# Patient Record
Sex: Female | Born: 1945 | Race: Black or African American | Hispanic: No | Smoking: Never smoker
Health system: Southern US, Community
[De-identification: ages and names within clinical notes are randomized; demographics above are authoritative.]

## PROBLEM LIST (undated history)

## (undated) DIAGNOSIS — N289 Disorder of kidney and ureter, unspecified: Secondary | ICD-10-CM

## (undated) DIAGNOSIS — D8689 Sarcoidosis of other sites: Secondary | ICD-10-CM

## (undated) DIAGNOSIS — E119 Type 2 diabetes mellitus without complications: Secondary | ICD-10-CM

## (undated) DIAGNOSIS — E785 Hyperlipidemia, unspecified: Secondary | ICD-10-CM

## (undated) DIAGNOSIS — I10 Essential (primary) hypertension: Secondary | ICD-10-CM

## (undated) DIAGNOSIS — K219 Gastro-esophageal reflux disease without esophagitis: Secondary | ICD-10-CM

## (undated) HISTORY — DX: Hyperlipidemia, unspecified: E78.5

## (undated) HISTORY — DX: Essential (primary) hypertension: I10

## (undated) HISTORY — DX: Type 2 diabetes mellitus without complications: E11.9

## (undated) HISTORY — DX: Gastro-esophageal reflux disease without esophagitis: K21.9

## (undated) HISTORY — PX: SPINE SURGERY: SHX786

---

## 2020-11-14 ENCOUNTER — Emergency Department (HOSPITAL_COMMUNITY): Payer: Medicare Other

## 2020-11-14 ENCOUNTER — Emergency Department (HOSPITAL_COMMUNITY)
Admission: EM | Admit: 2020-11-14 | Discharge: 2020-11-15 | Disposition: A | Discharge status: Home / Self Care | Payer: Medicare Other | Attending: Emergency Medicine | Admitting: Emergency Medicine

## 2020-11-14 ENCOUNTER — Other Ambulatory Visit: Payer: Self-pay

## 2020-11-14 ENCOUNTER — Encounter (HOSPITAL_COMMUNITY): Payer: Self-pay | Admitting: Emergency Medicine

## 2020-11-14 DIAGNOSIS — R350 Frequency of micturition: Secondary | ICD-10-CM | POA: Diagnosis present

## 2020-11-14 DIAGNOSIS — I129 Hypertensive chronic kidney disease with stage 1 through stage 4 chronic kidney disease, or unspecified chronic kidney disease: Secondary | ICD-10-CM | POA: Diagnosis not present

## 2020-11-14 DIAGNOSIS — R42 Dizziness and giddiness: Secondary | ICD-10-CM | POA: Insufficient documentation

## 2020-11-14 DIAGNOSIS — R531 Weakness: Secondary | ICD-10-CM | POA: Insufficient documentation

## 2020-11-14 DIAGNOSIS — R600 Localized edema: Secondary | ICD-10-CM | POA: Insufficient documentation

## 2020-11-14 DIAGNOSIS — N183 Chronic kidney disease, stage 3 unspecified: Secondary | ICD-10-CM | POA: Diagnosis not present

## 2020-11-14 DIAGNOSIS — N390 Urinary tract infection, site not specified: Secondary | ICD-10-CM | POA: Diagnosis not present

## 2020-11-14 HISTORY — DX: Sarcoidosis of other sites: D86.89

## 2020-11-14 HISTORY — DX: Disorder of kidney and ureter, unspecified: N28.9

## 2020-11-14 LAB — COMPREHENSIVE METABOLIC PANEL
ALT: 17 U/L (ref 0–44)
AST: 21 U/L (ref 15–41)
Albumin: 3.8 g/dL (ref 3.5–5.0)
Alkaline Phosphatase: 48 U/L (ref 38–126)
Anion gap: 8 (ref 5–15)
BUN: 26 mg/dL — ABNORMAL HIGH (ref 8–23)
CO2: 28 mmol/L (ref 22–32)
Calcium: 10.3 mg/dL (ref 8.9–10.3)
Chloride: 110 mmol/L (ref 98–111)
Creatinine, Ser: 1.49 mg/dL — ABNORMAL HIGH (ref 0.44–1.00)
GFR, Estimated: 36 mL/min — ABNORMAL LOW (ref >60)
Glucose, Bld: 76 mg/dL (ref 70–99)
Potassium: 4.5 mmol/L (ref 3.5–5.1)
Sodium: 146 mmol/L — ABNORMAL HIGH (ref 135–145)
Total Bilirubin: 0.6 mg/dL (ref 0.3–1.2)
Total Protein: 7.4 g/dL (ref 6.5–8.1)

## 2020-11-14 LAB — CBC WITH DIFFERENTIAL/PLATELET
Abs Immature Granulocytes: 0.02 10*3/uL (ref 0.00–0.07)
Basophils Absolute: 0.1 10*3/uL (ref 0.0–0.1)
Basophils Relative: 1 %
Eosinophils Absolute: 0.2 10*3/uL (ref 0.0–0.5)
Eosinophils Relative: 3 %
HCT: 38 % (ref 36.0–46.0)
Hemoglobin: 12.4 g/dL (ref 12.0–15.0)
Immature Granulocytes: 0 %
Lymphocytes Relative: 37 %
Lymphs Abs: 2.8 10*3/uL (ref 0.7–4.0)
MCH: 27 pg (ref 26.0–34.0)
MCHC: 32.6 g/dL (ref 30.0–36.0)
MCV: 82.6 fL (ref 80.0–100.0)
Monocytes Absolute: 0.7 10*3/uL (ref 0.1–1.0)
Monocytes Relative: 9 %
Neutro Abs: 3.8 10*3/uL (ref 1.7–7.7)
Neutrophils Relative %: 50 %
Platelets: 164 10*3/uL (ref 150–400)
RBC: 4.6 MIL/uL (ref 3.87–5.11)
RDW: 14 % (ref 11.5–15.5)
WBC: 7.6 10*3/uL (ref 4.0–10.5)
nRBC: 0 % (ref 0.0–0.2)

## 2020-11-14 LAB — URINALYSIS, ROUTINE W REFLEX MICROSCOPIC
Bilirubin Urine: NEGATIVE
Glucose, UA: NEGATIVE mg/dL
Ketones, ur: NEGATIVE mg/dL
Nitrite: NEGATIVE
Protein, ur: NEGATIVE mg/dL
Specific Gravity, Urine: 1.01 (ref 1.005–1.030)
pH: 6 (ref 5.0–8.0)

## 2020-11-14 LAB — URINALYSIS, MICROSCOPIC (REFLEX): WBC, UA: >50 WBC/hpf (ref 0–5)

## 2020-11-14 LAB — BRAIN NATRIURETIC PEPTIDE: B Natriuretic Peptide: 21.4 pg/mL (ref 0.0–100.0)

## 2020-11-14 IMAGING — CT CT HEAD W/O CM
3 of 4 series · 13 of 47 positions shown, 15 images · non-contrast
Comparison: None.

CLINICAL DATA: Lightheadedness.  History of neurosarcoidosis.

EXAM:
CT HEAD WITHOUT CONTRAST
TECHNIQUE: Contiguous axial images were obtained from the base of the skull
through the vertex without intravenous contrast.

[Series 2: head wo · axial · 0.47mm/px · z∈[-178,-58]mm · 7 of 32 slices shown, 9 images]
[im 4/32  brain]
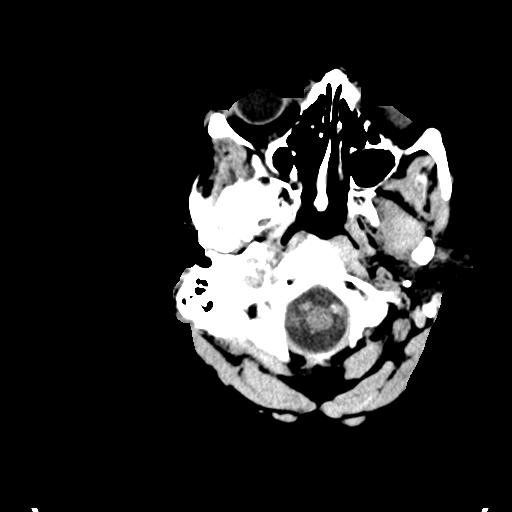
[im 4/32  bone]
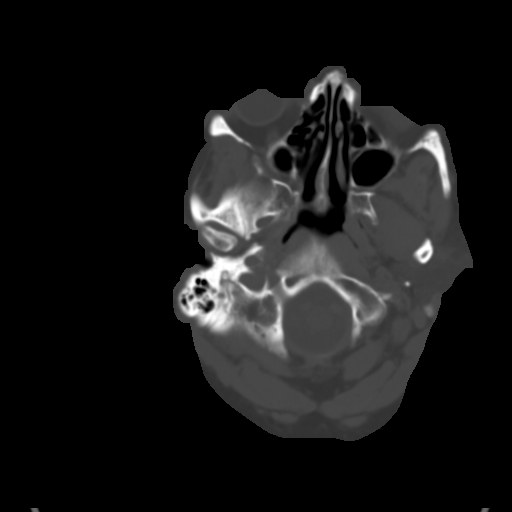
[im 8/32  brain]
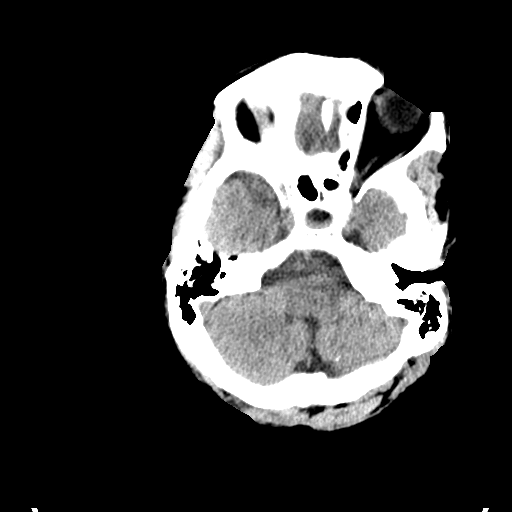
[im 12/32  brain]
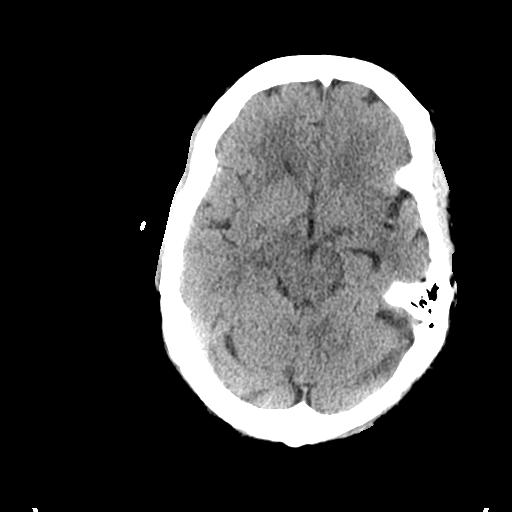
[im 16/32  brain]
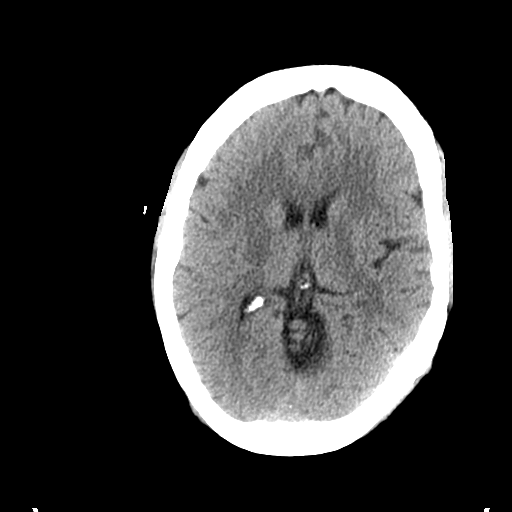
[im 20/32  brain]
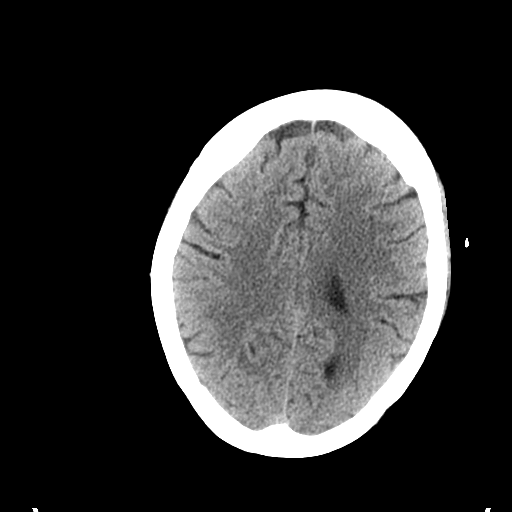
[im 20/32  bone]
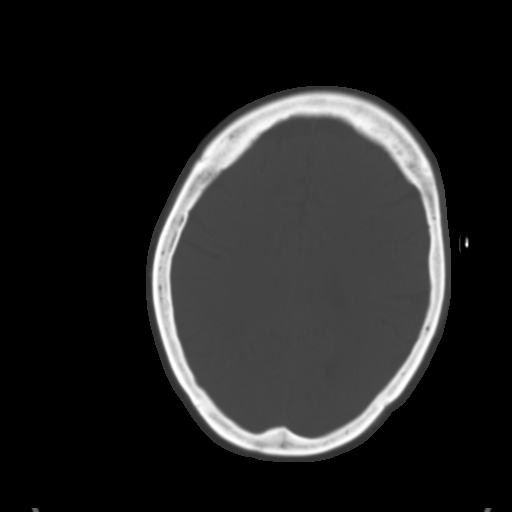
[im 24/32  brain]
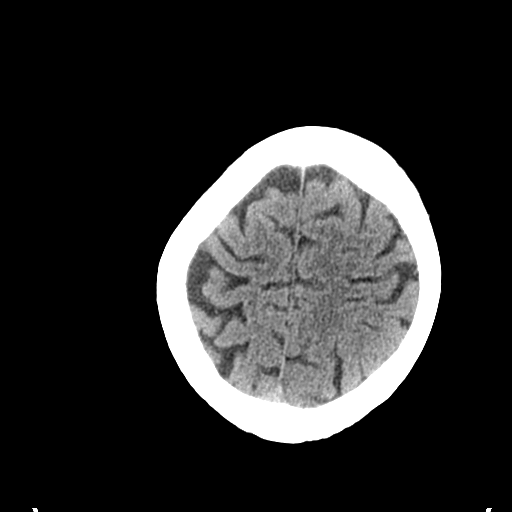
[im 28/32  brain]
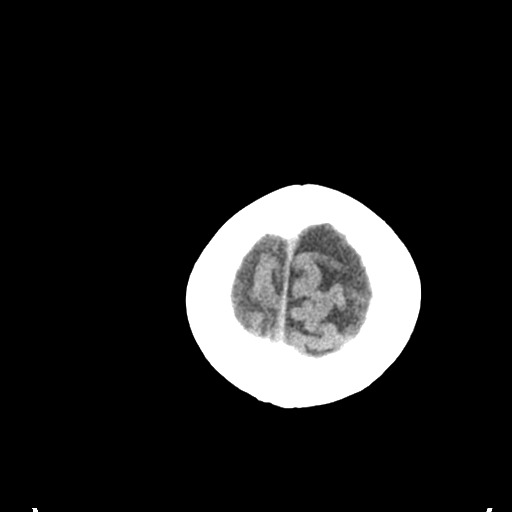

[Series 5: coronal soft tissue · coronal · 0.31mm/px · 3 of 74 slices shown]
[im 25/74  brain]
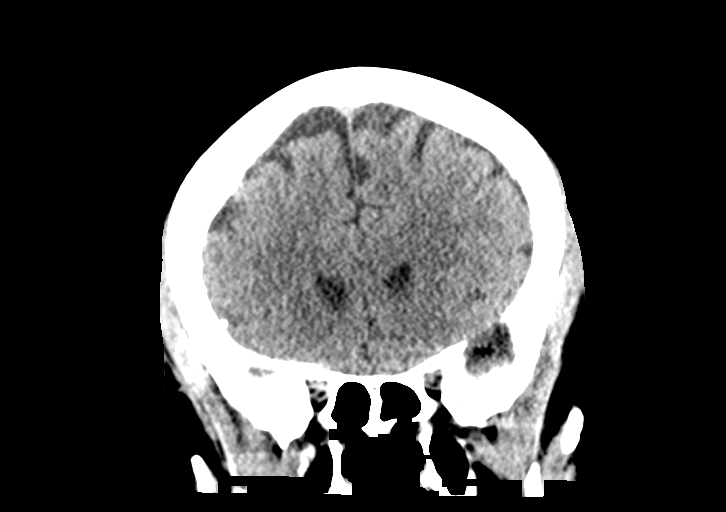
[im 33/74  brain]
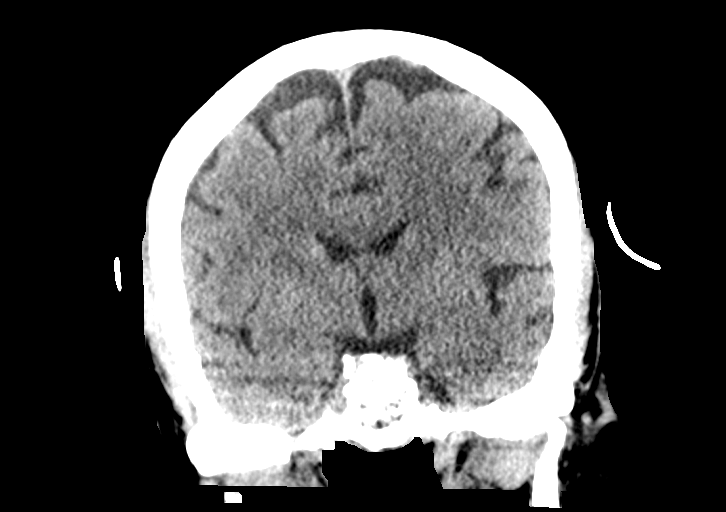
[im 41/74  brain]
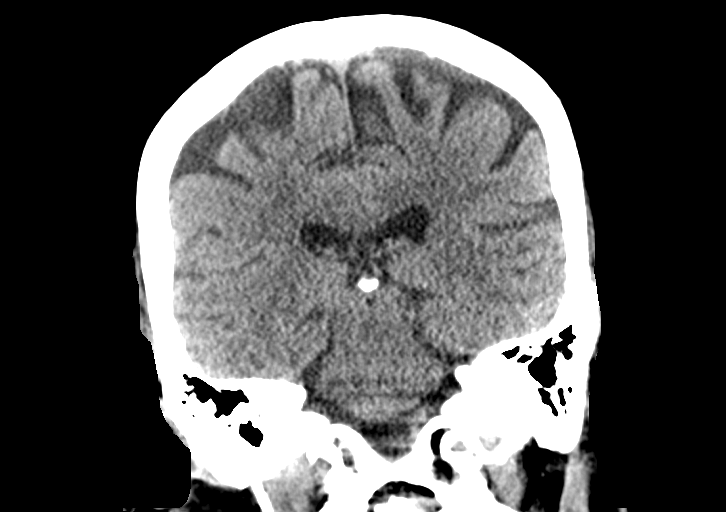

[Series 6: sagittal soft tissue · sagittal · 0.31mm/px · 3 of 58 slices shown]
[im 20/58  brain]
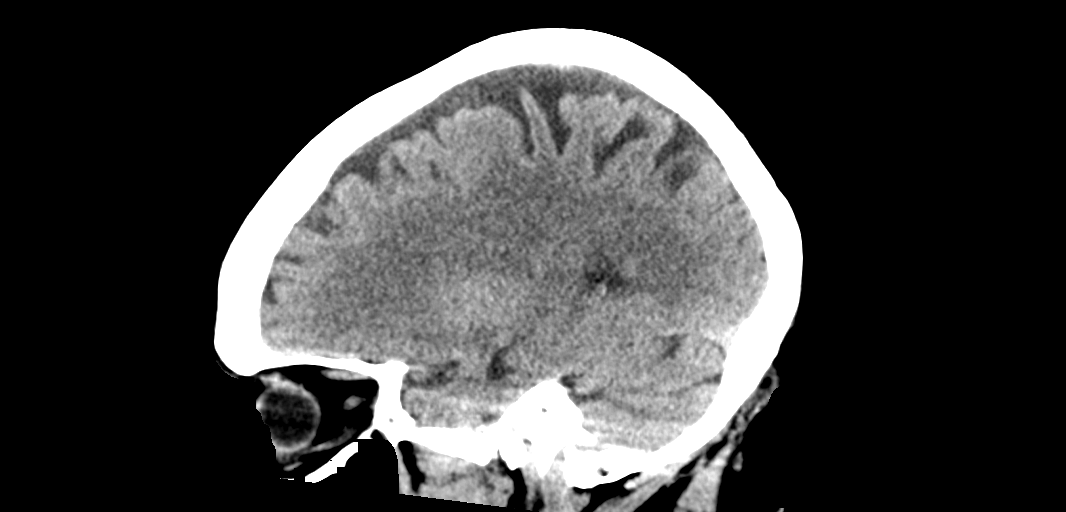
[im 29/58  brain]
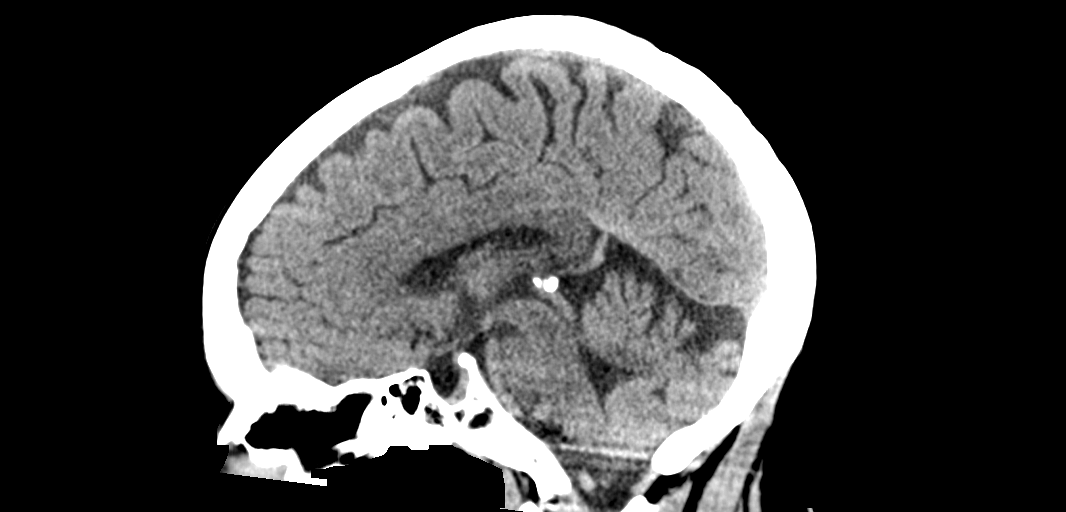
[im 39/58  brain]
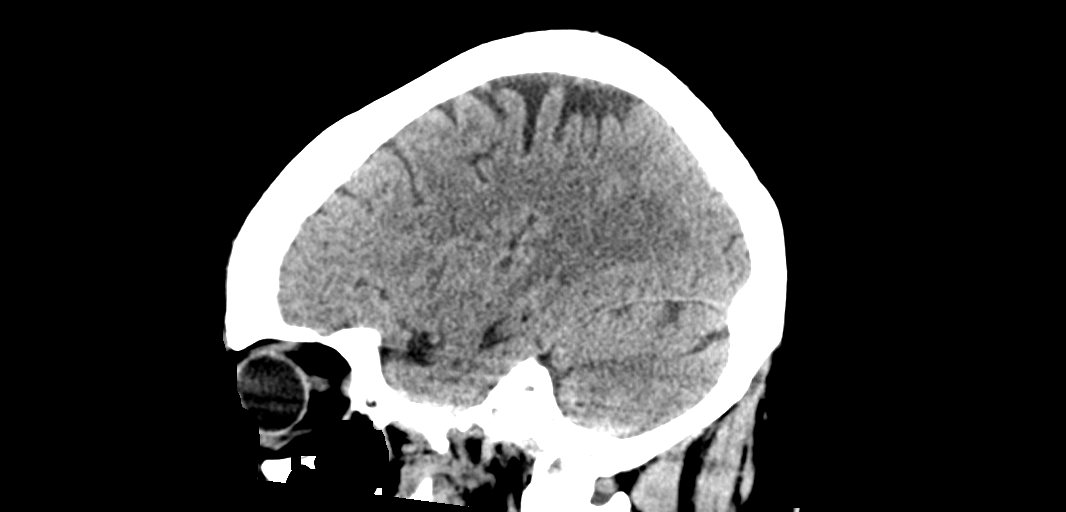

[13 of 47 positions shown; findings below may reference images not displayed]

FINDINGS: Brain: No evidence of acute infarction, hemorrhage, hydrocephalus,
extra-axial collection or mass lesion/mass effect. There is a single
punctate calcification in the left cerebellum.

Vascular: No hyperdense vessel or unexpected calcification.

Skull: Normal. Negative for fracture or focal lesion.

Sinuses/Orbits: No acute finding. There is mild mucosal thickening
of ethmoid air cells. The mastoid air cells appear clear.

Other: None.
IMPRESSION: No acute intracranial abnormality.

## 2020-11-14 IMAGING — CR DG CHEST 2V
2 series · 2 of 2 positions shown · non-contrast
Comparison: None.

CLINICAL DATA: Shortness of breath.

EXAM:
CHEST - 2 VIEW

[x chest ap]
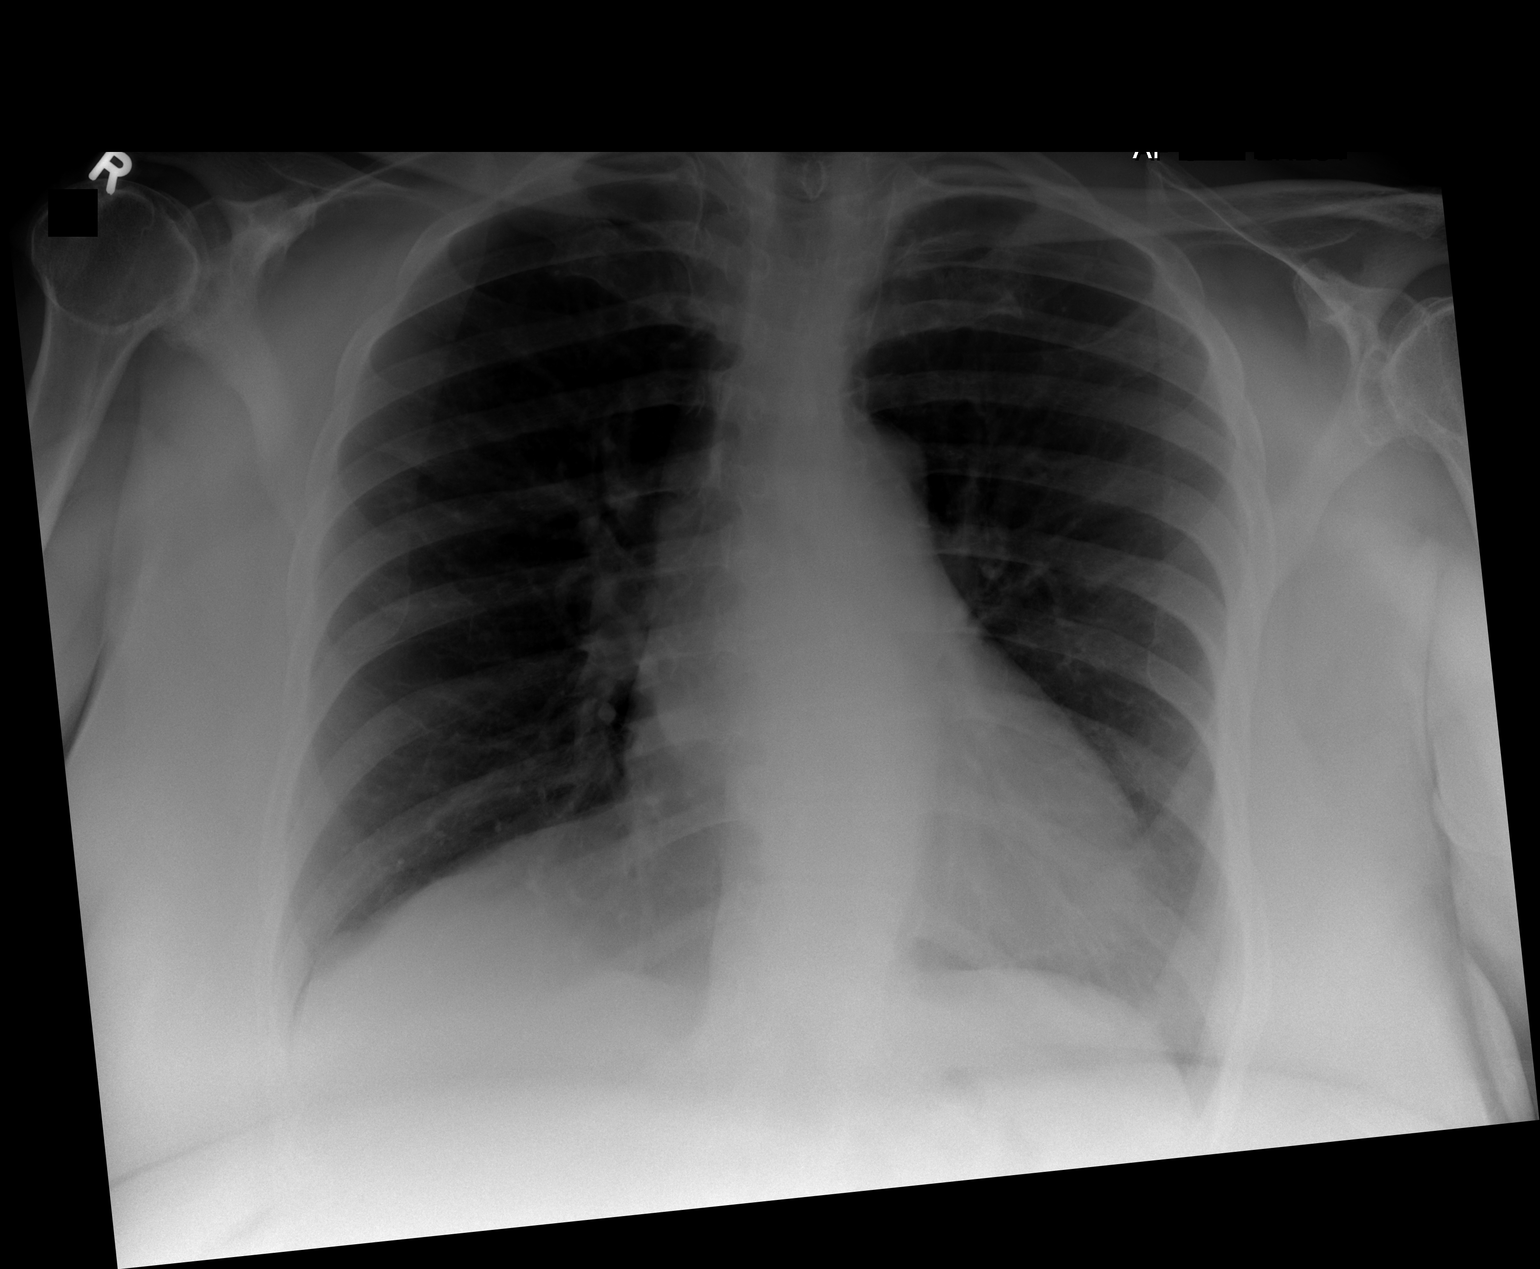

[w chest lat]
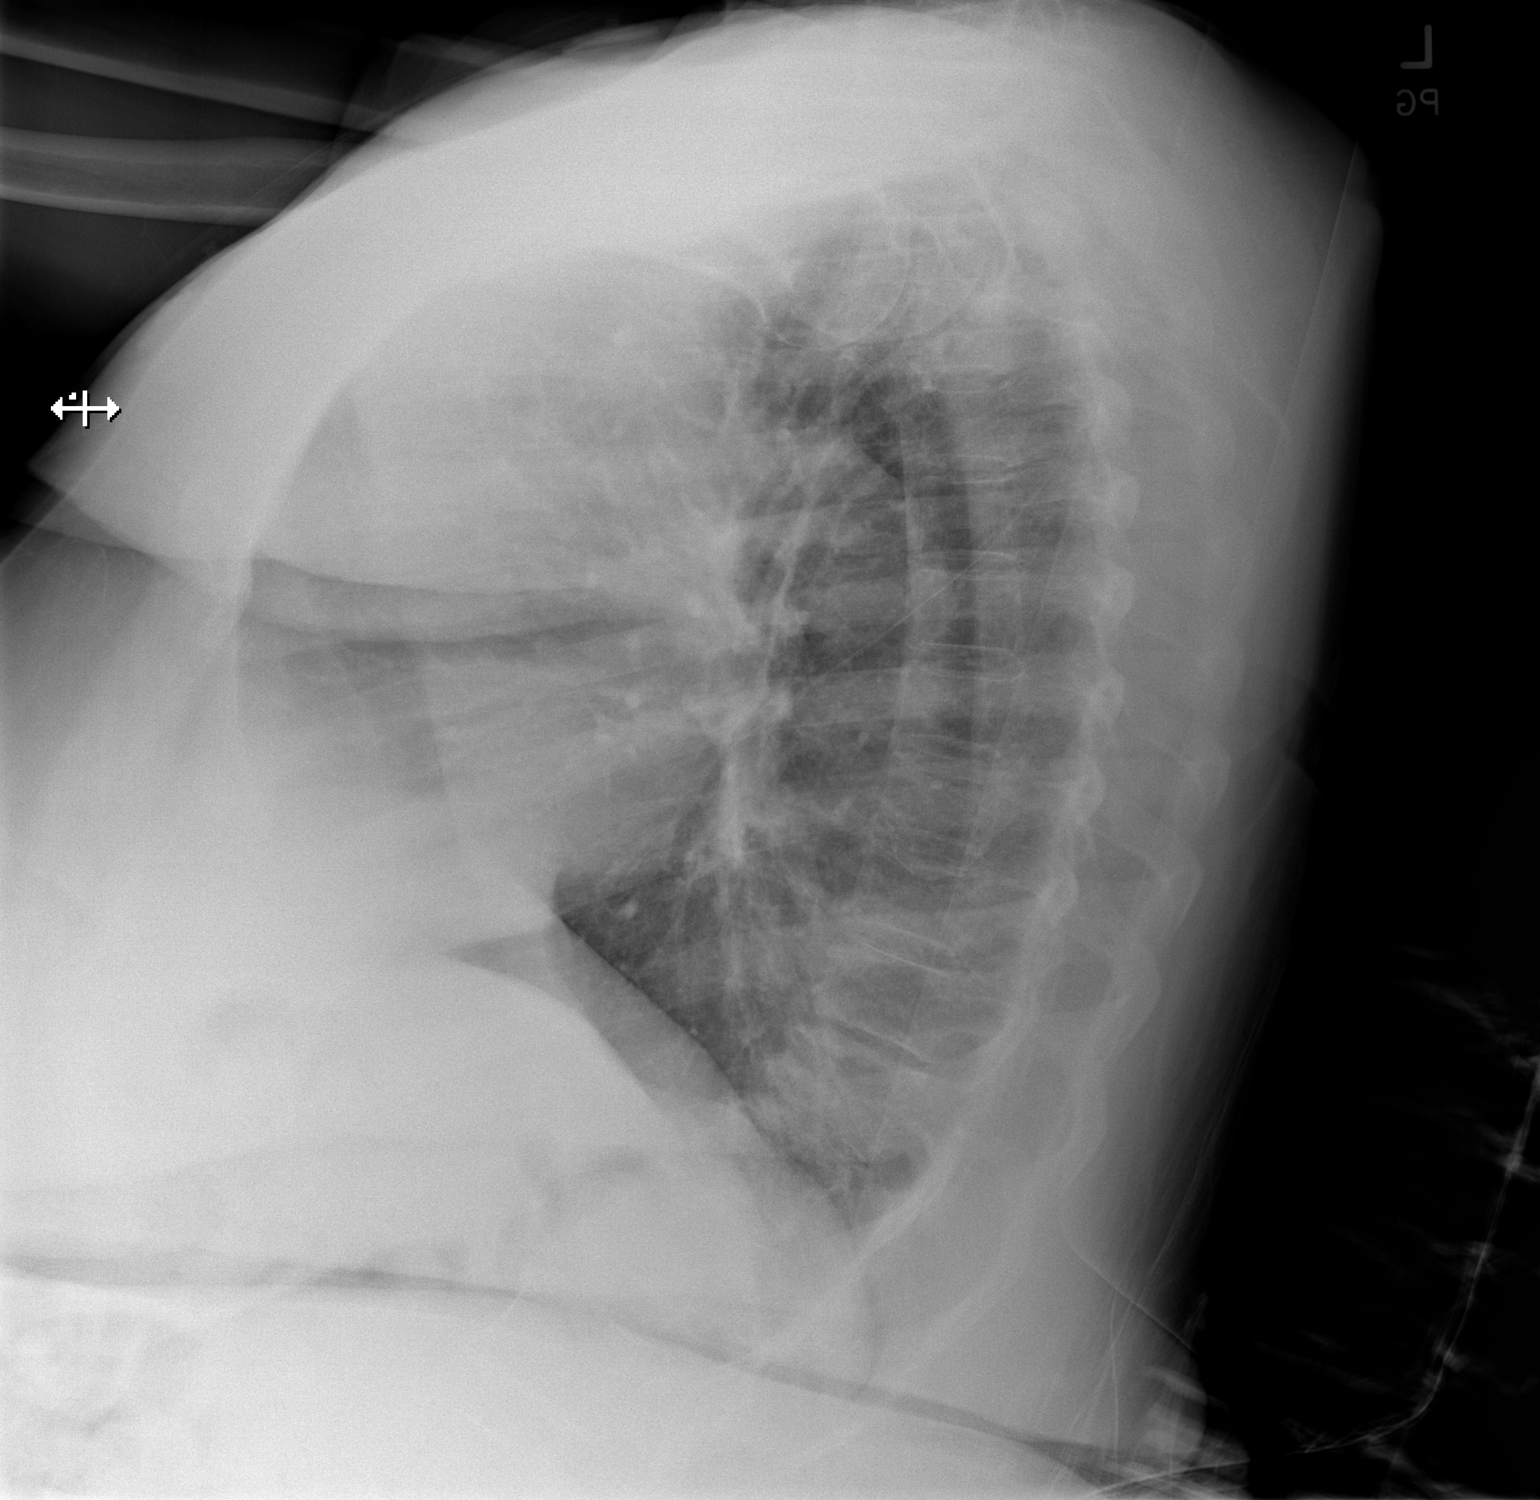

[2 of 2 positions shown; findings below may reference images not displayed]

FINDINGS: Mild eventration of right hemidiaphragm. No focal consolidation,
pleural effusion pneumothorax. The cardiac silhouette is within
limits. No acute osseous pathology. Degenerative changes of the
spine and shoulders.
IMPRESSION: No active cardiopulmonary disease.

## 2020-11-14 MED ORDER — CEPHALEXIN 250 MG PO CAPS: 250.0000 mg | ORAL_CAPSULE | Freq: Four times a day (QID) | ORAL | 0 refills | Status: AC

## 2020-11-14 NOTE — ED Notes (Signed)
Patient transported to X-ray 

## 2020-11-14 NOTE — ED Provider Notes (Signed)
Emergency Medicine Provider Triage Evaluation Note  Susan Jordan , a 75 y.o. female  was evaluated in triage.  Pt complains of gradual onset, intermittent, lightheadedness for the past 2 weeks.  She states whenever she talks on the phone for prolonged period of time she will feel generally weak and like she is going to pass out.  She states she will need to lay down for several minutes before this dissipates.  She does report she is here for an MRI.  Patient is speaking very fast with rapid to pressured speech.  She hands me a piece of paper in triage and states that her PCP in [place] ordered an MRI.  She has lived here for over a year however travel to [place] in March and saw her PCP who wanted an MRI brain done with history of neurosarcoidosis.  She was however not having any of the lightheadedness at that time.  She states that it was scheduled for sometime in April but she could not keep flying back up there for appointments and does not have a PCP here.  She states she wants an MRI now even though the dizziness/lightheadedness is new from in March of this year.  She denies any lightheadedness currently.  She denies full syncope..  Review of Systems  Positive: + lightheadedness Negative: - chest pain, SOB, syncope  Physical Exam  BP 123/83 (BP Location: Right Arm)   Pulse 70   Temp 98.7 F (37.1 C) (Oral)   Resp 18   Ht 5' 8.5" (1.74 m)   Wt 90.7 kg   SpO2 97%   BMI 29.97 kg/m  Gen:   Awake, no distress   Resp:  Normal effort  MSK:   Moves extremities without difficulty  Other:  Neuro intact. Rapid and pressured speech. Tearful.   Medical Decision Making  Medically screening exam initiated at 5:13 PM.  Appropriate orders placed.  AIXA CORSELLO was informed that the remainder of the evaluation will be completed by another provider, this initial triage assessment does not replace that evaluation, and the importance of remaining in the ED until their evaluation is complete.      Tanda Rockers, PA-C 11/14/20 1714    Lorre Nick, MD 11/18/20 1319

## 2020-11-14 NOTE — Discharge Instructions (Addendum)
Take the antibiotics as prescribed to treat your urine infection. Continue taking home medications as prescribed. I have put in a consultation with our social work team to help assist you with setting up primary care and possible therapy.  I have also put in a referral for neurology with the clinic listed below.  Follow-up with them regarding your neurosarcoidosis. Return to the ER with any new, worsening, concerning symptoms.

## 2020-11-14 NOTE — ED Provider Notes (Signed)
[facility]-EMERGENCY DEPT Provider Note   CSN: [id number] Arrival date & time: 11/14/20  1618     History Chief Complaint  Patient presents with   Dizziness    Susan Jordan is a 75 y.o. female presenting for evaluation of lightheadedness.  Patient states the past 2 weeks, she has been having intermittent lightheadedness which she describes as dizziness.  This is more likely to happen if she is talking or exerting herself in any way.  She states that by the time she sits down on the couch she feels like she is going to pass out.  No room spinning or feeling off balance.  No history of similar.  She denies recent fevers, chills, chest pain, shortness breath, cough, nausea vomiting, abdominal pain, abnormal bowel movements.  She is having urinary frequency, but no dysuria or hematuria. She has had leg swelling, but not changes from baseline.   She lives at home by herself.  She recently moved from [place] and does not have a PCP here.  She has a history of CKD stage III, HTN, neurosarcoidosis.  HPI     Past Medical History:  Diagnosis Date   Kidney disease    Neurosarcoidosis     There are no problems to display for this patient.    OB History   No obstetric history on file.     No family history on file.     Home Medications Prior to Admission medications   Medication Sig Start Date End Date Taking? Authorizing Provider  cephALEXin (KEFLEX) 250 MG capsule Take 1 capsule (250 mg total) by mouth 4 (four) times daily for 7 days. 11/14/20 11/21/20 Yes Raeanna Soberanes, PA-C    Allergies    Patient has no known allergies.  Review of Systems   Review of Systems  Cardiovascular:  Positive for leg swelling.  Neurological:  Positive for light-headedness.  All other systems reviewed and are negative.  Physical Exam Updated Vital Signs BP (!) 157/88   Pulse 70   Temp 98.7 F (37.1 C) (Oral)   Resp 18   Ht 5' 8.5" (1.74 m)   Wt 90.7 kg   SpO2  100%   BMI 29.97 kg/m   Physical Exam Vitals and nursing note reviewed.  Constitutional:      General: She is not in acute distress.    Appearance: Normal appearance. She is obese.  HENT:     Head: Normocephalic and atraumatic.  Eyes:     Extraocular Movements: Extraocular movements intact.     Conjunctiva/sclera: Conjunctivae normal.     Pupils: Pupils are equal, round, and reactive to light.  Cardiovascular:     Rate and Rhythm: Normal rate and regular rhythm.     Pulses: Normal pulses.  Pulmonary:     Effort: Pulmonary effort is normal. No respiratory distress.     Breath sounds: Normal breath sounds. No wheezing.     Comments: Speaking in full sentences.  Clear lung sounds in all fields. Abdominal:     General: There is no distension.     Palpations: Abdomen is soft. There is no mass.     Tenderness: There is no abdominal tenderness. There is no guarding or rebound.  Musculoskeletal:        General: Normal range of motion.     Cervical back: Normal range of motion and neck supple.     Right lower leg: Edema present.     Left lower leg: Edema present.  Comments: Mild, 1+ pitting edema bilaterally.  Skin:    General: Skin is warm and dry.     Capillary Refill: Capillary refill takes less than 2 seconds.  Neurological:     Mental Status: She is alert and oriented to person, place, and time.     Comments: CN intact.  Nose to finger intact.  Negative pronator drift.  Fine movement and cordination intact.  Strength and sensation intact x4.  Psychiatric:        Mood and Affect: Mood and affect normal.        Speech: Speech normal.        Behavior: Behavior normal.    ED Results / Procedures / Treatments   Labs (all labs ordered are listed, but only abnormal results are displayed) Labs Reviewed  COMPREHENSIVE METABOLIC PANEL - Abnormal; Notable for the following components:      Result Value   Sodium 146 (*)    BUN 26 (*)    Creatinine, Ser 1.49 (*)    GFR,  Estimated 36 (*)    All other components within normal limits  URINALYSIS, ROUTINE W REFLEX MICROSCOPIC - Abnormal; Notable for the following components:   APPearance HAZY (*)    Hgb urine dipstick MODERATE (*)    Leukocytes,Ua LARGE (*)    All other components within normal limits  URINALYSIS, MICROSCOPIC (REFLEX) - Abnormal; Notable for the following components:   Bacteria, UA MANY (*)    All other components within normal limits  CBC WITH DIFFERENTIAL/PLATELET  BRAIN NATRIURETIC PEPTIDE    EKG EKG Interpretation  Date/Time:  Friday November 14 2020 16:53:05 EDT Ventricular Rate:  71 PR Interval:  173 QRS Duration: 95 QT Interval:  378 QTC Calculation: 411 R Axis:   50 Text Interpretation: Sinus rhythm Confirmed by Palumbo, April ([id number]) on 11/14/2020 11:43:11 PM  Radiology DG Chest 2 View  Result Date: 11/14/2020 CLINICAL DATA:  Shortness of breath. EXAM: CHEST - 2 VIEW COMPARISON:  None. FINDINGS: Mild eventration of right hemidiaphragm. No focal consolidation, pleural effusion pneumothorax. The cardiac silhouette is within limits. No acute osseous pathology. Degenerative changes of the spine and shoulders. IMPRESSION: No active cardiopulmonary disease. Electronically Signed   By: Elgie Collard M.D.   On: 11/14/2020 23:30   CT Head Wo Contrast  Result Date: 11/14/2020 CLINICAL DATA:  Lightheadedness.  History of neurosarcoidosis. EXAM: CT HEAD WITHOUT CONTRAST TECHNIQUE: Contiguous axial images were obtained from the base of the skull through the vertex without intravenous contrast. COMPARISON:  None. FINDINGS: Brain: No evidence of acute infarction, hemorrhage, hydrocephalus, extra-axial collection or mass lesion/mass effect. There is a single punctate calcification in the left cerebellum. Vascular: No hyperdense vessel or unexpected calcification. Skull: Normal. Negative for fracture or focal lesion. Sinuses/Orbits: No acute finding. There is mild mucosal thickening of ethmoid  air cells. The mastoid air cells appear clear. Other: None. IMPRESSION: No acute intracranial abnormality. Electronically Signed   By: Darliss Cheney M.D.   On: 11/14/2020 19:12    Procedures Procedures   Medications Ordered in ED Medications - No data to display  ED Course  I have reviewed the triage vital signs and the nursing notes.  Pertinent labs & imaging results that were available during my care of the patient were reviewed by me and considered in my medical decision making (see chart for details).    MDM Rules/Calculators/A&P  Patient presented for evaluation of lightheadedness and generalized weakness, especially with exertion.  On exam, patient appears nontoxic.  She has a normal neuro exam and is asymptomatic on my evaluation.  This is been going on for several weeks, and she does not have any focal deficits, doubt stroke.  This is likely more organic in nature and may also possibly be due to deconditioning.  Labs obtained from triage interpreted by me, overall reassuring.  Creatinine is minimally elevated at 1.49, however this is consistent with her history of CKD.  Sodium is slightly elevated at 146, no previous to compare.  Her urine does show many bacteria, this could be contributing to her new dizziness/lightheadedness.  However in the setting of what sounds most like dyspnea on exertion, will also check BNP, chest x-ray, and EKG to ensure no cardiac cause.  Will check orthostatics and ensure patient is able to ambulate without hypoxia.  If work-up is overall reassuring, plan for treatment for UTI.  Will consult with social work to assist with PCP and neurology follow-up.  Orthostatics negative. Pt ambulatory without walker.  BNP negative.  Chest x-ray viewed and independently interpreted by me, no pneumonia or cardiomegaly or pleural effusion.  Discussed findings with patient and family.  Discussed treatment for UTI and importance of follow-up with PCP  and neurology.  At this time, patient appears safe for discharge.  Return precautions given.  Patient states she understands and agrees to plan  Final Clinical Impression(s) / ED Diagnoses Final diagnoses:  Urinary tract infection without hematuria, site unspecified  Lightheadedness    Rx / DC Orders ED Discharge Orders          Ordered    cephALEXin (KEFLEX) 250 MG capsule  4 times daily        11/14/20 2344    Ambulatory referral to Neurology       Comments: An appointment is requested in approximately: 2 wks   11/14/20 2344             Alveria Apley, PA-C 11/15/20 0009    Palumbo, April, MD 11/15/20 2376

## 2020-11-14 NOTE — ED Notes (Signed)
Pt was able to stand with the walker  with minimal assistants

## 2020-11-14 NOTE — ED Triage Notes (Signed)
Patient complains of dizziness x2 weeks, states she gets dizzy after talking for long periods of time, patient has neurosarcoidosis and Stage 3 kidney disease. PCP recommended a MRI of the brian, has not gotten that yet.

## 2020-11-14 NOTE — ED Notes (Signed)
ED Provider at bedside. 

## 2020-11-15 NOTE — ED Notes (Signed)
Pts o2 remained at 100% on RA during exertion.

## 2020-12-01 ENCOUNTER — Ambulatory Visit: Payer: Self-pay | Admitting: *Deleted

## 2020-12-01 NOTE — Telephone Encounter (Signed)
Summary: trouble walking   Pt called the missed/cancelled line to get a new PCP/ I scheduled pt for [facility] office / pt mentioned she has trouble walking and can barely walk due to an issue with her leg. / this is not a new issue / she mentioned wanting Physical therapy and a new walker/ please advise or check with pt if needed / leave a message if she doesn't answer/ she stated she doesn't answer unknown numbers     Attempted to call patient- left message to call back regarding her symptoms.

## 2020-12-01 NOTE — Telephone Encounter (Signed)
Pt. Reports she has an appointment with new PCP. Reports she will talk to her new doctor about ordering physical Therapy. States she doesn't "need anything else."   Answer Assessment - Initial Assessment Questions 1. DESCRIPTION: "Describe how you are feeling."     Difficulty walking 2. SEVERITY: "How bad is it?"  "Can you stand and walk?"   - MILD - Feels weak or tired, but does not interfere with work, school or normal activities   - MODERATE - Able to stand and walk; weakness interferes with work, school, or normal activities   - SEVERE - Unable to stand or walk     Moderate 3. ONSET:  "When did the weakness begin?"     Months ago 4. CAUSE: "What do you think is causing the weakness?"     Unsure 5. MEDICINES: "Have you recently started a new medicine or had a change in the amount of a medicine?"     No 6. OTHER SYMPTOMS: "Do you have any other symptoms?" (e.g., chest pain, fever, cough, SOB, vomiting, diarrhea, bleeding, other areas of pain)     No 7. PREGNANCY: "Is there any chance you are pregnant?" "When was your last menstrual period?"     No  Protocols used: Weakness (Generalized) and Fatigue-A-AH

## 2020-12-10 ENCOUNTER — Ambulatory Visit (INDEPENDENT_AMBULATORY_CARE_PROVIDER_SITE_OTHER): Payer: Medicare Other | Admitting: Nurse Practitioner

## 2020-12-10 ENCOUNTER — Other Ambulatory Visit: Payer: Self-pay

## 2020-12-10 ENCOUNTER — Encounter: Payer: Self-pay | Admitting: Nurse Practitioner

## 2020-12-10 VITALS — BP 123/75 | HR 86 | Temp 98.0°F

## 2020-12-10 DIAGNOSIS — I10 Essential (primary) hypertension: Secondary | ICD-10-CM | POA: Insufficient documentation

## 2020-12-10 DIAGNOSIS — Z7689 Persons encountering health services in other specified circumstances: Secondary | ICD-10-CM | POA: Insufficient documentation

## 2020-12-10 DIAGNOSIS — E782 Mixed hyperlipidemia: Secondary | ICD-10-CM | POA: Diagnosis not present

## 2020-12-10 NOTE — Patient Instructions (Signed)
High Cholesterol High cholesterol is a condition in which the blood has high levels of a white, waxy substance similar to fat (cholesterol). The liver makes all the cholesterol that the body needs. The human body needs small amounts of cholesterol to help build cells. A person gets extra or excess cholesterol from the food that he or she eats. The blood carries cholesterol from the liver to the rest of the body. If you have high cholesterol, deposits (plaques) may build up on the walls of your arteries. Arteries are the blood vessels that carry blood away from your heart. These plaques make the arteries narrow and stiff. Cholesterol plaques increase your risk for heart attack and stroke. Work with your health care provider to keep your cholesterol levels in a healthy range. What increases the risk? The following factors may make you more likely to develop this condition: Eating foods that are high in animal fat (saturated fat) or cholesterol. Being overweight. Not getting enough exercise. A family history of high cholesterol (familial hypercholesterolemia). Use of tobacco products. Having diabetes. What are the signs or symptoms? In most cases, high cholesterol does not usually cause any symptoms. In severe cases, very high cholesterol levels can cause: Fatty bumps under the skin (xanthomas). A white or gray ring around the black center (pupil) of the eye. How is this diagnosed? This condition may be diagnosed based on the results of a blood test. If you are older than 76 years of age, your health care provider may check your cholesterol levels every 4-6 years. You may be checked more often if you have high cholesterol or other risk factors for heart disease. The blood test for cholesterol measures: "Bad" cholesterol, or LDL cholesterol. This is the main type of cholesterol that causes heart disease. The desired level is less than 100 mg/dL (2.59 mmol/L). "Good" cholesterol, or HDL  cholesterol. HDL helps protect against heart disease by cleaning the arteries and carrying the LDL to the liver for processing. The desired level for HDL is 60 mg/dL (1.55 mmol/L) or higher. Triglycerides. These are fats that your body can store or burn for energy. The desired level is less than 150 mg/dL (1.69 mmol/L). Total cholesterol. This measures the total amount of cholesterol in your blood and includes LDL, HDL, and triglycerides. The desired level is less than 200 mg/dL (5.17 mmol/L). How is this treated? Treatment for high cholesterol starts with lifestyle changes, such as diet and exercise. Diet changes. You may be asked to eat foods that have more fiber and less saturated fats or added sugar. Lifestyle changes. These may include regular exercise, maintaining a healthy weight, and quitting use of tobacco products. Medicines. These are given when diet and lifestyle changes have not worked. You may be prescribed a statin medicine to help lower your cholesterol levels. Follow these instructions at home: Eating and drinking  Eat a healthy, balanced diet. This diet includes: Daily servings of a variety of fresh, frozen, or canned fruits and vegetables. Daily servings of whole grain foods that are rich in fiber. Foods that are low in saturated fats and trans fats. These include poultry and fish without skin, lean cuts of meat, and low-fat dairy products. A variety of fish, especially oily fish that contain omega-3 fatty acids. Aim to eat fish at least 2 times a week. Avoid foods and drinks that have added sugar. Use healthy cooking methods, such as roasting, grilling, broiling, baking, poaching, steaming, and stir-frying. Do not fry your food except for stir-frying.   If you drink alcohol: Limit how much you have to: 0-1 drink a day for women who are not pregnant. 0-2 drinks a day for men. Know how much alcohol is in a drink. In the [place] one drink equals one 12 oz bottle of beer (355 mL),  one 5 oz glass of wine (148 mL), or one 1 oz glass of hard liquor (44 mL). Lifestyle  Get regular exercise. Aim to exercise for a total of 150 minutes a week. Increase your activity level by doing activities such as gardening, walking, and taking the stairs. Do not use any products that contain nicotine or tobacco. These products include cigarettes, chewing tobacco, and vaping devices, such as e-cigarettes. If you need help quitting, ask your health care provider. General instructions Take over-the-counter and prescription medicines only as told by your health care provider. Keep all follow-up visits. This is important. Where to find more information American Heart Association: www.heart.org National Heart, Lung, and Blood Institute: www.nhlbi.nih.gov Contact a health care provider if: You have trouble achieving or maintaining a healthy diet or weight. You are starting an exercise program. You are unable to stop smoking. Get help right away if: You have chest pain. You have trouble breathing. You have discomfort or pain in your jaw, neck, back, shoulder, or arm. You have any symptoms of a stroke. "BE FAST" is an easy way to remember the main warning signs of a stroke: B - Balance. Signs are dizziness, sudden trouble walking, or loss of balance. E - Eyes. Signs are trouble seeing or a sudden change in vision. F - Face. Signs are sudden weakness or numbness of the face, or the face or eyelid drooping on one side. A - Arms. Signs are weakness or numbness in an arm. This happens suddenly and usually on one side of the body. S - Speech. Signs are sudden trouble speaking, slurred speech, or trouble understanding what people say. T - Time. Time to call emergency services. Write down what time symptoms started. You have other signs of a stroke, such as: A sudden, severe headache with no known cause. Nausea or vomiting. Seizure. These symptoms may represent a serious problem that is an  emergency. Do not wait to see if the symptoms will go away. Get medical help right away. Call your local emergency services (911 in the [place] Do not drive yourself to the hospital. Summary Cholesterol plaques increase your risk for heart attack and stroke. Work with your health care provider to keep your cholesterol levels in a healthy range. Eat a healthy, balanced diet, get regular exercise, and maintain a healthy weight. Do not use any products that contain nicotine or tobacco. These products include cigarettes, chewing tobacco, and vaping devices, such as e-cigarettes. Get help right away if you have any symptoms of a stroke. This information is not intended to replace advice given to you by your health care provider. Make sure you discuss any questions you have with your health care provider. Document Revised: 05/08/2020 Document Reviewed: 04/28/2020 Elsevier Patient Education  2022 Elsevier Inc. Hypertension, Adult Hypertension is another name for high blood pressure. High blood pressure forces your heart to work harder to pump blood. This can cause problems over time. There are two numbers in a blood pressure reading. There is a top number (systolic) over a bottom number (diastolic). It is best to have a blood pressure that is below 120/80. Healthy choices can help lower your blood pressure, or you may need medicine to help lower   it. What are the causes? The cause of this condition is not known. Some conditions may be related to high blood pressure. What increases the risk? Smoking. Having type 2 diabetes mellitus, high cholesterol, or both. Not getting enough exercise or physical activity. Being overweight. Having too much fat, sugar, calories, or salt (sodium) in your diet. Drinking too much alcohol. Having long-term (chronic) kidney disease. Having a family history of high blood pressure. Age. Risk increases with age. Race. You may be at higher risk if you are African  American. Gender. Men are at higher risk than women before age 45. After age 65, women are at higher risk than men. Having obstructive sleep apnea. Stress. What are the signs or symptoms? High blood pressure may not cause symptoms. Very high blood pressure (hypertensive crisis) may cause: Headache. Feelings of worry or nervousness (anxiety). Shortness of breath. Nosebleed. A feeling of being sick to your stomach (nausea). Throwing up (vomiting). Changes in how you see. Very bad chest pain. Seizures. How is this treated? This condition is treated by making healthy lifestyle changes, such as: Eating healthy foods. Exercising more. Drinking less alcohol. Your health care provider may prescribe medicine if lifestyle changes are not enough to get your blood pressure under control, and if: Your top number is above 130. Your bottom number is above 80. Your personal target blood pressure may vary. Follow these instructions at home: Eating and drinking  If told, follow the DASH eating plan. To follow this plan: Fill one half of your plate at each meal with fruits and vegetables. Fill one fourth of your plate at each meal with whole grains. Whole grains include whole-wheat pasta, brown rice, and whole-grain bread. Eat or drink low-fat dairy products, such as skim milk or low-fat yogurt. Fill one fourth of your plate at each meal with low-fat (lean) proteins. Low-fat proteins include fish, chicken without skin, eggs, beans, and tofu. Avoid fatty meat, cured and processed meat, or chicken with skin. Avoid pre-made or processed food. Eat less than 1,500 mg of salt each day. Do not drink alcohol if: Your doctor tells you not to drink. You are pregnant, may be pregnant, or are planning to become pregnant. If you drink alcohol: Limit how much you use to: 0-1 drink a day for women. 0-2 drinks a day for men. Be aware of how much alcohol is in your drink. In the [place] one drink equals one 12  oz bottle of beer (355 mL), one 5 oz glass of wine (148 mL), or one 1 oz glass of hard liquor (44 mL). Lifestyle  Work with your doctor to stay at a healthy weight or to lose weight. Ask your doctor what the best weight is for you. Get at least 30 minutes of exercise most days of the week. This may include walking, swimming, or biking. Get at least 30 minutes of exercise that strengthens your muscles (resistance exercise) at least 3 days a week. This may include lifting weights or doing Pilates. Do not use any products that contain nicotine or tobacco, such as cigarettes, e-cigarettes, and chewing tobacco. If you need help quitting, ask your doctor. Check your blood pressure at home as told by your doctor. Keep all follow-up visits as told by your doctor. This is important. Medicines Take over-the-counter and prescription medicines only as told by your doctor. Follow directions carefully. Do not skip doses of blood pressure medicine. The medicine does not work as well if you skip doses. Skipping doses also puts   you at risk for problems. Ask your doctor about side effects or reactions to medicines that you should watch for. Contact a doctor if you: Think you are having a reaction to the medicine you are taking. Have headaches that keep coming back (recurring). Feel dizzy. Have swelling in your ankles. Have trouble with your vision. Get help right away if you: Get a very bad headache. Start to feel mixed up (confused). Feel weak or numb. Feel faint. Have very bad pain in your: Chest. Belly (abdomen). Throw up more than once. Have trouble breathing. Summary Hypertension is another name for high blood pressure. High blood pressure forces your heart to work harder to pump blood. For most people, a normal blood pressure is less than 120/80. Making healthy choices can help lower blood pressure. If your blood pressure does not get lower with healthy choices, you may need to take  medicine. This information is not intended to replace advice given to you by your health care provider. Make sure you discuss any questions you have with your health care provider. Document Revised: 11/02/2017 Document Reviewed: 11/02/2017 Elsevier Patient Education  2022 Elsevier Inc.  

## 2020-12-10 NOTE — Progress Notes (Signed)
New Patient Note  RE: Susan Jordan MRN: [id number] DOB: 04-15-1945 Date of Office Visit: 12/10/2020  Chief Complaint: New Patient (Initial Visit), Hypertension, and Hyperlipidemia  History of Present Illness:   Patient presents for follow up of hypertension. Patient was diagnosed few years ago The patient is tolerating the medication well without side effects. Compliance with treatment has been good; including taking medication as directed , maintains a healthy diet , and following up as directed.    Mixed hyperlipidemia   Pt presents with hyperlipidemia. Patient was diagnosed a few years Compliance with treatment has been good The patient is compliant with medications, maintains a low cholesterol diet , follows up as directed , and maintains an exercise regimen . The patient denies experiencing any hypercholesterolemia related symptoms.     Assessment and Plan: Susan Jordan is a 75 y.o. female with: Essential hypertension Continue current medication and low sodium diet, blood pressure well controlled on current blood pressure medication and follow in in 3 months.   Mixed hyperlipidemia Continue   Establishing care with new doctor, encounter for Patient establishing care today.  Completed head to toe assessment.  No new concerns.  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 Patient will continue to follow-up for chronic disease management every 3 months with labs.  Return in about 3 months (around 03/12/2021).   Diagnostics:   Past Medical History: Patient Active Problem List   Diagnosis Date Noted   Essential hypertension 12/10/2020   Mixed hyperlipidemia 12/10/2020   Establishing care with new doctor, encounter for 12/10/2020   Past Medical History:  Diagnosis Date   Diabetes mellitus without complication (HCC)    GERD (gastroesophageal reflux disease)    Hyperlipidemia    Hypertension    Kidney disease    Neurosarcoidosis     Past Surgical History:  Medication List:  Current Outpatient Medications  Medication Sig Dispense Refill   glipiZIDE (GLUCOTROL XL) 5 MG 24 hr tablet Take 5 mg by mouth daily.     lisinopril-hydrochlorothiazide (ZESTORETIC) 20-25 MG tablet Take 1 tablet by mouth daily.     metoprolol succinate (TOPROL-XL) 25 MG 24 hr tablet Take 25 mg by mouth daily.     pantoprazole (PROTONIX) 40 MG tablet Take 40 mg by mouth daily.     simvastatin (ZOCOR) 40 MG tablet simvastatin 40 mg tablet     No current facility-administered medications for this visit.   Allergies: No Known Allergies Social History: Social History   Socioeconomic History   Marital status: Widowed    Spouse name: Not on file   Number of children: 5   Years of education: Not on file   Highest education level: Not on file  Occupational History   Not on file  Tobacco Use   Smoking status: Never   Smokeless tobacco: Never  Vaping Use   Vaping Use: Never used  Substance and Sexual Activity   Alcohol use: Never   Drug use: Never   Sexual activity: Not on file  Other Topics Concern   Not on file  Social History Narrative   Not on file   Social Determinants of Health   Financial Resource Strain: Not on file  Food Insecurity: Not on file  Transportation Needs: Not on file  Physical Activity: Not on file  Stress: Not on file  Social Connections: Not on file       Family History: Family History  Problem Relation Age of Onset   Cancer Mother  colon         Review of Systems  Constitutional: Negative.   HENT: Negative.    Eyes: Negative.   Respiratory: Negative.    Gastrointestinal: Negative.   Genitourinary: Negative.   Skin:  Negative for rash.  All other systems reviewed and are negative. Objective: BP 123/75   Pulse 86   Temp 98 F (36.7 C)   SpO2 97%  There is no height or weight on file to calculate BMI. Physical Exam Vitals and nursing note reviewed.  Constitutional:       Appearance: Normal appearance. She is obese.  HENT:     Head: Normocephalic.     Mouth/Throat:     Mouth: Mucous membranes are moist.     Pharynx: Oropharynx is clear.  Eyes:     Conjunctiva/sclera: Conjunctivae normal.  Cardiovascular:     Rate and Rhythm: Normal rate and regular rhythm.  Pulmonary:     Effort: Pulmonary effort is normal.  Abdominal:     General: Bowel sounds are normal.  Skin:    General: Skin is warm.     Findings: No rash.  Neurological:     Mental Status: She is alert and oriented to person, place, and time.  Psychiatric:        Mood and Affect: Mood normal.        Behavior: Behavior normal.   The plan was reviewed with the patient/family, and all questions/concerned were addressed.  It was my pleasure to see Susan Jordan today and participate in her care. Please feel free to contact me with any questions or concerns.  Sincerely,  Lynnell Chad NP [facility]

## 2020-12-10 NOTE — Assessment & Plan Note (Signed)
Continue

## 2020-12-10 NOTE — Assessment & Plan Note (Signed)
Patient establishing care today.  Completed head to toe assessment.  No new concerns.  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 Patient will continue to follow-up for chronic disease management every 3 months with labs.

## 2020-12-10 NOTE — Assessment & Plan Note (Signed)
Continue current medication and low sodium diet, blood pressure well controlled on current blood pressure medication and follow in in 3 months.

## 2021-02-03 ENCOUNTER — Telehealth: Payer: Self-pay | Admitting: Neurology

## 2021-02-03 ENCOUNTER — Ambulatory Visit: Payer: Medicare Other | Admitting: Neurology

## 2021-02-03 ENCOUNTER — Encounter: Payer: Self-pay | Admitting: Neurology

## 2021-02-03 ENCOUNTER — Other Ambulatory Visit: Payer: Self-pay

## 2021-02-03 VITALS — BP 123/73 | HR 96 | Ht 68.5 in

## 2021-02-03 DIAGNOSIS — D8689 Sarcoidosis of other sites: Secondary | ICD-10-CM | POA: Diagnosis not present

## 2021-02-03 DIAGNOSIS — R269 Unspecified abnormalities of gait and mobility: Secondary | ICD-10-CM

## 2021-02-03 NOTE — Progress Notes (Signed)
Chief Complaint  Patient presents with   New Patient (Initial Visit)    Rm 14. Alone. Pt saw NP Jac Canavan but is looking for a new primary care doctor in [place] [employer]. NP/Internal referral for intermittent lightheadedness/neurosarcoidosis. Pt would like to address pain in left leg.      ASSESSMENT AND PLAN  Susan Jordan is a 75 y.o. female   Neurosarcoidosis  Per patient was confirmed by left thigh mass and spinal cord pathology  Was previously treated with high-dose of steroid, but has been off steroid for many years,  Progressive gait abnormality, no upper extremity symptoms  MRI of the brain, thoracic spine with without contrast,   DIAGNOSTIC DATA (LABS, IMAGING, TESTING) - I reviewed patient records, labs, notes, testing and imaging myself where available.   MEDICAL HISTORY:  Susan Jordan is a 75 year old female, her primary care nurse practitioner to follow Ivy Lynn, probable neurosarcoidosis, initial evaluation was on February 03, 2021  I reviewed and summarized the referring note. PMHX HTN HLD DM Neurosarcoidosis  She was diagnosed with neurosarcoidosis more than 30 years ago at [facility], she presented with left thigh mass,  biopsy-proven neurosarcoidosis, she was treated with tapering down dose of prednisone, which has helped her symptoms,  Later she developed gradual worsening gait abnormality, evaluation found spinal cord involvement, also had lower thoracic upper spines lumbar decompression surgery, pathology confirmed neurosarcoidosis per patient  With steroid treatment, her symptoms has much improved, she was able to ambulate without assistant, in recent couple years, she noticed gradual worsening gait abnormality, she contributed to stress, knee pain, she lost her husband in 2021, moved to [place] to be closer to her family, she lives independently, spent most of time in house, no longer ambulatory, able to transfer herself in  and out of wheelchair, denies significant bowel and bladder incontinence,   PHYSICAL EXAM:   Vitals:   02/03/21 0808  BP: 123/73  Pulse: 96  Height: 5' 8.5" (1.74 m)   Not recorded     Body mass index is 29.97 kg/m.  PHYSICAL EXAMNIATION:  Gen: NAD, conversant, well nourised, well groomed                     Cardiovascular: Regular rate rhythm, no peripheral edema, warm, nontender. Eyes: Conjunctivae clear without exudates or hemorrhage Neck: Supple, no carotid bruits. Pulmonary: Clear to auscultation bilaterally   NEUROLOGICAL EXAM:  MENTAL STATUS: Speech:    Speech is normal; fluent and spontaneous with normal comprehension.  Cognition:     Orientation to time, place and person     Normal recent and remote memory     Normal Attention span and concentration     Normal Language, naming, repeating,spontaneous speech     Fund of knowledge   CRANIAL NERVES: CN II: Visual fields are full to confrontation. Pupils are round equal and briskly reactive to light. CN III, IV, VI: extraocular movement are normal. No ptosis. CN V: Facial sensation is intact to light touch CN VII: Face is symmetric with normal eye closure  CN VIII: Hearing is normal to causal conversation. CN IX, X: Phonation is normal. CN XI: Head turning and shoulder shrug are intact  MOTOR: Upper extremity motor strength is normal, moderate bilateral lower extremity proximal muscle weakness, right worse than left, barely antigravity movement of right lower extremity still muscles  REFLEXES: Reflexes are 2+ and symmetric at the biceps, triceps, hyperreflexia at knees, and absent at  ankles. Plantar responses are flexor.  SENSORY: Length dependent decreased light touch, pinprick to bilateral knee level  COORDINATION: There is no trunk or limb dysmetria noted.  GAIT/STANCE: Deferred  REVIEW OF SYSTEMS:  Full 14 system review of systems performed and notable only for as above All other review of systems  were negative.   ALLERGIES: No Known Allergies  HOME MEDICATIONS: Current Outpatient Medications  Medication Sig Dispense Refill   glipiZIDE (GLUCOTROL XL) 5 MG 24 hr tablet Take 5 mg by mouth daily.     lisinopril-hydrochlorothiazide (ZESTORETIC) 20-25 MG tablet Take 1 tablet by mouth daily.     metoprolol succinate (TOPROL-XL) 25 MG 24 hr tablet Take 25 mg by mouth daily.     pantoprazole (PROTONIX) 40 MG tablet Take 40 mg by mouth daily.     simvastatin (ZOCOR) 40 MG tablet simvastatin 40 mg tablet     Blood Glucose Monitoring Suppl (ACCU-CHEK GUIDE) w/Device KIT by Does not apply route. (Patient not taking: Reported on 02/03/2021)     No current facility-administered medications for this visit.    PAST MEDICAL HISTORY: Past Medical History:  Diagnosis Date   Diabetes mellitus without complication (HCC)    GERD (gastroesophageal reflux disease)    Hyperlipidemia    Hypertension    Kidney disease    Neurosarcoidosis     PAST SURGICAL HISTORY: Past Surgical History:  Procedure Laterality Date   SPINE SURGERY      FAMILY HISTORY: Family History  Problem Relation Age of Onset   Cancer Mother        colon    SOCIAL HISTORY: Social History   Socioeconomic History   Marital status: Widowed    Spouse name: Not on file   Number of children: 5   Years of education: Not on file   Highest education level: Not on file  Occupational History   Not on file  Tobacco Use   Smoking status: Never   Smokeless tobacco: Never  Vaping Use   Vaping Use: Never used  Substance and Sexual Activity   Alcohol use: Never   Drug use: Never   Sexual activity: Not on file  Other Topics Concern   Not on file  Social History Narrative   Not on file   Social Determinants of Health   Financial Resource Strain: Not on file  Food Insecurity: Not on file  Transportation Needs: Not on file  Physical Activity: Not on file  Stress: Not on file  Social Connections: Not on file   Intimate Partner Violence: Not on file      Marcial Pacas, M.D. Ph.D.  [facility] [address] [place], [place] [address] Ph: [phone] Fax: [phone]  CC:  Franchot Heidelberg, PA-C [place],  [place] [address]  Ivy Lynn, NP

## 2021-02-03 NOTE — Telephone Encounter (Signed)
[employer] medicare no auth req order faxed to [facility], they will reach out to the patient to schedule

## 2021-02-03 NOTE — Telephone Encounter (Signed)
Getting Medical Record from   Dominic Pea, MD  Phone: [phone]

## 2021-02-04 ENCOUNTER — Telehealth: Payer: Self-pay | Admitting: Nurse Practitioner

## 2021-02-04 ENCOUNTER — Telehealth: Payer: Self-pay | Admitting: Neurology

## 2021-02-04 NOTE — Telephone Encounter (Signed)
She is in wheelchair, lives alone,if it is truly impossible to send home health agent, just let patient know,

## 2021-02-04 NOTE — Telephone Encounter (Signed)
Order for home health was put in. Patient has UHC Medicare which is a very difficult insurance to place with a home health agency. Many home health agencies will not accept this insurance because it does not pay well for home health. None of the agencies we work with ([facility], [facility], [facility], [facility], [facility]) will accept this insurance at this time.    Is it possible for patient to go to outpatient physical therapy? 

## 2021-02-04 NOTE — Telephone Encounter (Signed)
sent [facility] for   [facility] does not participate with insurance plan

## 2021-02-04 NOTE — Telephone Encounter (Signed)
°  Left message for patient to call back and schedule Medicare Annual Wellness Visit (AWV) to be completed by video or phone. ° °No hx of AWV eligible for AWVI as of  12/07/2019 per [facility]  ° °Please schedule at anytime with [facility] Nurse Health Advisor --- Zamari Jane ° °45 Minutes appointment  ° °Any questions, please call me at [phone]   °

## 2021-02-10 NOTE — Telephone Encounter (Signed)
I called patient and LVM regarding the referral. Advised in VM that her insurance is harder to pair with an agency because most don't accept the insurance. Advised I will call her back with an update (may be able to pair patient's referral with another patient's referral to get agency to accept).

## 2021-02-11 NOTE — Telephone Encounter (Signed)
Referral to home health sent to Angie w/ [facility] (one of their [place] offices). They will review and let me know if they can accept.

## 2021-02-13 DIAGNOSIS — L309 Dermatitis, unspecified: Secondary | ICD-10-CM | POA: Insufficient documentation

## 2021-02-13 DIAGNOSIS — N950 Postmenopausal bleeding: Secondary | ICD-10-CM | POA: Insufficient documentation

## 2021-02-13 DIAGNOSIS — E119 Type 2 diabetes mellitus without complications: Secondary | ICD-10-CM | POA: Insufficient documentation

## 2021-02-13 DIAGNOSIS — N840 Polyp of corpus uteri: Secondary | ICD-10-CM | POA: Insufficient documentation

## 2021-02-16 ENCOUNTER — Telehealth: Payer: Self-pay

## 2021-02-16 ENCOUNTER — Ambulatory Visit (INDEPENDENT_AMBULATORY_CARE_PROVIDER_SITE_OTHER): Payer: Medicare Other

## 2021-02-16 VITALS — Ht 69.0 in | Wt 200.0 lb

## 2021-02-16 DIAGNOSIS — E782 Mixed hyperlipidemia: Secondary | ICD-10-CM | POA: Diagnosis not present

## 2021-02-16 DIAGNOSIS — Z0000 Encounter for general adult medical examination without abnormal findings: Secondary | ICD-10-CM

## 2021-02-16 DIAGNOSIS — D8689 Sarcoidosis of other sites: Secondary | ICD-10-CM

## 2021-02-16 DIAGNOSIS — I10 Essential (primary) hypertension: Secondary | ICD-10-CM | POA: Diagnosis not present

## 2021-02-16 DIAGNOSIS — Z1231 Encounter for screening mammogram for malignant neoplasm of breast: Secondary | ICD-10-CM

## 2021-02-16 DIAGNOSIS — Z0001 Encounter for general adult medical examination with abnormal findings: Secondary | ICD-10-CM

## 2021-02-16 DIAGNOSIS — Z780 Asymptomatic menopausal state: Secondary | ICD-10-CM

## 2021-02-16 DIAGNOSIS — R262 Difficulty in walking, not elsewhere classified: Secondary | ICD-10-CM

## 2021-02-16 NOTE — Patient Instructions (Signed)
Susan Jordan , Thank you for taking time to come for your Medicare Wellness Visit. I appreciate your ongoing commitment to your health goals. Please review the following plan we discussed and let me know if I can assist you in the future.   Screening recommendations/referrals: Colonoscopy: No longer required due to age.  Mammogram: Order placed today.  Bone Density: Order placed today.   Recommended yearly ophthalmology/optometry visit for glaucoma screening and checkup Recommended yearly dental visit for hygiene and checkup  Vaccinations: Influenza vaccine: Due. Repeat annually  Pneumococcal vaccine: Declined. Tdap vaccine: Due. Repeat in 10 years  Shingles vaccine: Declined.   Covid-19:Done 04/21/2019, 05/19/2019, 01/13/2020 and 07/11/2020.  Advanced directives: Advance directive discussed with you today. Even though you declined this today, please call our office should you change your mind, and we can give you the proper paperwork for you to fill out.   Conditions/risks identified: Drink 6-8 glasses of water and eat lots of fruits and vegetables.   Next appointment: Follow up in one year for your annual wellness visit 2023.   Preventive Care 78 Years and Older, Female Preventive care refers to lifestyle choices and visits with your health care provider that can promote health and wellness. What does preventive care include? A yearly physical exam. This is also called an annual well check. Dental exams once or twice a year. Routine eye exams. Ask your health care provider how often you should have your eyes checked. Personal lifestyle choices, including: Daily care of your teeth and gums. Regular physical activity. Eating a healthy diet. Avoiding tobacco and drug use. Limiting alcohol use. Practicing safe sex. Taking low-dose aspirin every day. Taking vitamin and mineral supplements as recommended by your health care provider. What happens during an annual well check? The  services and screenings done by your health care provider during your annual well check will depend on your age, overall health, lifestyle risk factors, and family history of disease. Counseling  Your health care provider may ask you questions about your: Alcohol use. Tobacco use. Drug use. Emotional well-being. Home and relationship well-being. Sexual activity. Eating habits. History of falls. Memory and ability to understand (cognition). Work and work Astronomer. Reproductive health. Screening  You may have the following tests or measurements: Height, weight, and BMI. Blood pressure. Lipid and cholesterol levels. These may be checked every 5 years, or more frequently if you are over 40 years old. Skin check. Lung cancer screening. You may have this screening every year starting at age 36 if you have a 30-pack-year history of smoking and currently smoke or have quit within the past 15 years. Fecal occult blood test (FOBT) of the stool. You may have this test every year starting at age 19. Flexible sigmoidoscopy or colonoscopy. You may have a sigmoidoscopy every 5 years or a colonoscopy every 10 years starting at age 84. Hepatitis C blood test. Hepatitis B blood test. Sexually transmitted disease (STD) testing. Diabetes screening. This is done by checking your blood sugar (glucose) after you have not eaten for a while (fasting). You may have this done every 1-3 years. Bone density scan. This is done to screen for osteoporosis. You may have this done starting at age 7. Mammogram. This may be done every 1-2 years. Talk to your health care provider about how often you should have regular mammograms. Talk with your health care provider about your test results, treatment options, and if necessary, the need for more tests. Vaccines  Your health care provider may recommend  certain vaccines, such as: Influenza vaccine. This is recommended every year. Tetanus, diphtheria, and acellular  pertussis (Tdap, Td) vaccine. You may need a Td booster every 10 years. Zoster vaccine. You may need this after age 89. Pneumococcal 13-valent conjugate (PCV13) vaccine. One dose is recommended after age 59. Pneumococcal polysaccharide (PPSV23) vaccine. One dose is recommended after age 90. Talk to your health care provider about which screenings and vaccines you need and how often you need them. This information is not intended to replace advice given to you by your health care provider. Make sure you discuss any questions you have with your health care provider. Document Released: 03/21/2015 Document Revised: 11/12/2015 Document Reviewed: 12/24/2014 Elsevier Interactive Patient Education  2017 [employer] Prevention in the Home Falls can cause injuries. They can happen to people of all ages. There are many things you can do to make your home safe and to help prevent falls. What can I do on the outside of my home? Regularly fix the edges of walkways and driveways and fix any cracks. Remove anything that might make you trip as you walk through a door, such as a raised step or threshold. Trim any bushes or trees on the path to your home. Use bright outdoor lighting. Clear any walking paths of anything that might make someone trip, such as rocks or tools. Regularly check to see if handrails are loose or broken. Make sure that both sides of any steps have handrails. Any raised decks and porches should have guardrails on the edges. Have any leaves, snow, or ice cleared regularly. Use sand or salt on walking paths during winter. Clean up any spills in your garage right away. This includes oil or grease spills. What can I do in the bathroom? Use night lights. Install grab bars by the toilet and in the tub and shower. Do not use towel bars as grab bars. Use non-skid mats or decals in the tub or shower. If you need to sit down in the shower, use a plastic, non-slip stool. Keep the floor  dry. Clean up any water that spills on the floor as soon as it happens. Remove soap buildup in the tub or shower regularly. Attach bath mats securely with double-sided non-slip rug tape. Do not have throw rugs and other things on the floor that can make you trip. What can I do in the bedroom? Use night lights. Make sure that you have a light by your bed that is easy to reach. Do not use any sheets or blankets that are too big for your bed. They should not hang down onto the floor. Have a firm chair that has side arms. You can use this for support while you get dressed. Do not have throw rugs and other things on the floor that can make you trip. What can I do in the kitchen? Clean up any spills right away. Avoid walking on wet floors. Keep items that you use a lot in easy-to-reach places. If you need to reach something above you, use a strong step stool that has a grab bar. Keep electrical cords out of the way. Do not use floor polish or wax that makes floors slippery. If you must use wax, use non-skid floor wax. Do not have throw rugs and other things on the floor that can make you trip. What can I do with my stairs? Do not leave any items on the stairs. Make sure that there are handrails on both sides of the  stairs and use them. Fix handrails that are broken or loose. Make sure that handrails are as long as the stairways. Check any carpeting to make sure that it is firmly attached to the stairs. Fix any carpet that is loose or worn. Avoid having throw rugs at the top or bottom of the stairs. If you do have throw rugs, attach them to the floor with carpet tape. Make sure that you have a light switch at the top of the stairs and the bottom of the stairs. If you do not have them, ask someone to add them for you. What else can I do to help prevent falls? Wear shoes that: Do not have high heels. Have rubber bottoms. Are comfortable and fit you well. Are closed at the toe. Do not wear  sandals. If you use a stepladder: Make sure that it is fully opened. Do not climb a closed stepladder. Make sure that both sides of the stepladder are locked into place. Ask someone to hold it for you, if possible. Clearly mark and make sure that you can see: Any grab bars or handrails. First and last steps. Where the edge of each step is. Use tools that help you move around (mobility aids) if they are needed. These include: Canes. Walkers. Scooters. Crutches. Turn on the lights when you go into a dark area. Replace any light bulbs as soon as they burn out. Set up your furniture so you have a clear path. Avoid moving your furniture around. If any of your floors are uneven, fix them. If there are any pets around you, be aware of where they are. Review your medicines with your doctor. Some medicines can make you feel dizzy. This can increase your chance of falling. Ask your doctor what other things that you can do to help prevent falls. This information is not intended to replace advice given to you by your health care provider. Make sure you discuss any questions you have with your health care provider. Document Released: 12/19/2008 Document Revised: 07/31/2015 Document Reviewed: 03/29/2014 Elsevier Interactive Patient Education  2017 [employer].

## 2021-02-16 NOTE — Progress Notes (Signed)
Subjective:   Susan Jordan is a 75 y.o. female who presents for Medicare Annual (Subsequent) preventive examination. Virtual Visit via Telephone Note  I connected with  Susan Jordan on 02/16/21 at  8:15 AM EST by telephone and verified that I am speaking with the correct person using two identifiers.  Location: Patient: HOME Provider: [facility] Persons participating in the virtual visit: patient/Nurse Health Advisor   I discussed the limitations, risks, security and privacy concerns of performing an evaluation and management service by telephone and the availability of in person appointments. The patient expressed understanding and agreed to proceed.  Interactive audio and video telecommunications were attempted between this nurse and patient, however failed, due to patient having technical difficulties OR patient did not have access to video capability.  We continued and completed visit with audio only.  Some vital signs may be absent or patient reported.   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     Objective:    Today's Vitals   02/16/21 0811  Weight: 200 lb (90.7 kg)  Height: '5\' 9"'  (1.753 m)   Body mass index is 29.53 kg/m.  Advanced Directives 02/16/2021 11/14/2020  Does Patient Have a Medical Advance Directive? No No  Would patient like information on creating a medical advance directive? No - Patient declined -    Current Medications (verified) Outpatient Encounter Medications as of 02/16/2021  Medication Sig   Blood Glucose Monitoring Suppl (ACCU-CHEK GUIDE) w/Device KIT by Does not apply route.   glipiZIDE (GLUCOTROL XL) 5 MG 24 hr tablet Take 5 mg by mouth daily.   lisinopril-hydrochlorothiazide (ZESTORETIC) 20-25 MG tablet Take 1 tablet by mouth daily.   metoprolol  succinate (TOPROL-XL) 25 MG 24 hr tablet Take 25 mg by mouth daily.   pantoprazole (PROTONIX) 40 MG tablet Take 40 mg by mouth daily.   simvastatin (ZOCOR) 40 MG tablet simvastatin 40 mg tablet   No facility-administered encounter medications on file as of 02/16/2021.    Allergies (verified) Patient has no known allergies.   History: Past Medical History:  Diagnosis Date   Diabetes mellitus without complication (HCC)    GERD (gastroesophageal reflux disease)    Hyperlipidemia    Hypertension    Kidney disease    Neurosarcoidosis    Past Surgical History:  Procedure Laterality Date   SPINE SURGERY     Family History  Problem Relation Age of Onset   Cancer Mother        colon   Social History   Socioeconomic History   Marital status: Widowed    Spouse name: Not on file   Number of children: 5   Years of education: Not on file   Highest education level: Not on file  Occupational History   Not on file  Tobacco Use   Smoking status: Never   Smokeless tobacco: Never  Vaping Use   Vaping Use: Never used  Substance and Sexual Activity   Alcohol use: Never   Drug use: Never   Sexual activity: Not Currently  Other Topics Concern   Not on file  Social History Narrative   5 sons, all live away.   Sister and sister in law live close by.   Social Determinants of Health   Financial Resource Strain: Low Risk    Difficulty of Paying Living Expenses: Not hard at all  Food Insecurity: No Food Insecurity   Worried About Charity fundraiser in the Last Year: Never true   Ran Out of Food in the Last Year: Never true  Transportation Needs: Unmet Transportation Needs   Lack of Transportation (Medical): Yes   Lack of Transportation (Non-Medical): No  Physical Activity: Inactive   Days of Exercise per Week: 0 days   Minutes of Exercise per Session: 0 min  Stress: No Stress Concern Present   Feeling of Stress : Not at all  Social Connections: Socially Integrated   Frequency  of Communication with Friends and Family: More than three times a week   Frequency of Social Gatherings with Friends and Family: Twice a week   Attends Religious Services: 1 to 4 times per year   Active Member of [employer] or Organizations: Yes   Attends Archivist Meetings: 1 to 4 times per year   Marital Status: Married    Tobacco Counseling Counseling given: Not Answered   Clinical Intake:  Pre-visit preparation completed: Yes  Pain : No/denies pain     BMI - recorded: 29.53 Nutritional Status: BMI 25 -29 Overweight Nutritional Risks: None Diabetes: Yes  How often do you need to have someone help you when you read instructions, pamphlets, or other written materials from your doctor or pharmacy?: 1 - Never  Diabetic?Nutrition Risk Assessment:  Has the patient had any N/V/D within the last 2 months?  No  Does the patient have any non-healing wounds?  No  Has the patient had any unintentional weight loss or weight gain?  No   Diabetes:  Is the patient diabetic?  Yes  If diabetic, was a CBG obtained today?  No  Did the patient bring in their glucometer from home?  No  PHONE VISIT.  How often do you monitor your CBG's? BID.   Financial Strains and Diabetes Management:  Are you having any financial strains with the device, your supplies or your medication? No .  Does the patient want to be seen by Chronic Care Management for management of their diabetes?  Yes  Would the patient like to be referred to a Nutritionist or for Diabetic Management?  No   Diabetic Exams:  Diabetic Eye Exam: Completed 05/2020. Marland Kitchen Pt has been advised about the importance in completing this exam.   Diabetic Foot Exam: Completed DUE. Pt has been advised about the importance in completing this exam. Interpreter Needed?: No  Information entered by :: MJ Tandi Hanko, LPN   Activities of Daily Living In your present state of health, do you have any difficulty performing the following activities:  02/16/2021  Hearing? N  Vision? N  Difficulty concentrating or making decisions? N  Walking or climbing stairs? Y  Dressing or bathing? Y  Doing errands, shopping? Y  Preparing Food and eating ? N  Using the Toilet? N  In the past six months, have you accidently leaked urine? N  Do you have problems with loss of bowel control? N  Managing your Medications? N  Managing your Finances? N  Housekeeping or managing your Housekeeping? Y    Patient Care Team: Ivy Lynn, NP as PCP - General (Nurse Practitioner)  Indicate any recent Medical Services you may have received from other than [facility] providers in the past year (date may be approximate).     Assessment:   This is a routine wellness examination for Susan Jordan.  Hearing/Vision screen Hearing Screening - Comments:: No hearing issues.  Vision Screening - Comments::  Glasses. March 2022. MD in [place]. Would like referral for MD in [place].  Dietary issues and exercise activities discussed: Current Exercise Habits: The patient does not participate in regular exercise at present, Exercise limited by: neurologic condition(s);cardiac condition(s);Other - see comments (Neuro-Sarcoidosis)   Goals Addressed             This Visit's Progress    Exercise 3x per week (30 min per time)       Pt would like referral for PT to help strengthen legs due to Neuro-Sarcoidosis.       Depression Screen PHQ 2/9 Scores 02/16/2021 12/10/2020  PHQ - 2 Score 0 0    Fall Risk Fall Risk  02/16/2021 12/10/2020  Falls in the past year? 0 1  Number falls in past yr: 0 1  Injury with Fall? 0 0  Risk for fall due to : Impaired balance/gait;Impaired mobility History of fall(s);Impaired balance/gait  Follow up Falls prevention discussed Falls evaluation completed    FALL RISK PREVENTION PERTAINING TO THE HOME:  Any stairs in or around the home? No  If so, are there any without handrails? No  Home free of loose throw rugs in walkways, pet beds,  electrical cords, etc? Yes  Adequate lighting in your home to reduce risk of falls? Yes   ASSISTIVE DEVICES UTILIZED TO PREVENT FALLS:  Life alert? No  Pt states that she keeps her cell phone with her at all times. Use of a cane, walker or w/c? Yes  Grab bars in the bathroom? Yes  Shower chair or bench in shower? Yes  Elevated toilet seat or a handicapped toilet? No   TIMED UP AND GO:  Was the test performed? No .  PHONE VISIT.  Cognitive Function:     6CIT Screen 02/16/2021  What Year? 0 points  What month? 0 points  What time? 0 points  Count back from 20 0 points  Months in reverse 0 points  Repeat phrase 10 points  Total Score 10    Immunizations Immunization History  Administered Date(s) Administered   Moderna SARS-COV2 Booster Vaccination 01/13/2020, 07/11/2020   Moderna Sars-Covid-2 Vaccination 04/21/2019, 05/19/2019    TDAP status: Due, Education has been provided regarding the importance of this vaccine. Advised may receive this vaccine at local pharmacy or Health Dept. Aware to provide a copy of the vaccination record if obtained from local pharmacy or Health Dept. Verbalized acceptance and understanding.  Flu Vaccine status: Due, Education has been provided regarding the importance of this vaccine. Advised may receive this vaccine at local pharmacy or Health Dept. Aware to provide a copy of the vaccination record if obtained from local pharmacy or Health Dept. Verbalized acceptance and understanding.  Pneumococcal vaccine status: Due, Education has been provided regarding the importance of this vaccine. Advised may receive this vaccine at local pharmacy or Health Dept. Aware to provide a copy of the vaccination record if obtained from local pharmacy or Health Dept. Verbalized acceptance and understanding.  Covid-19 vaccine status: Completed vaccines  Qualifies for Shingles Vaccine? Yes   Zostavax completed No   Shingrix Completed?: No.    Education has been  provided regarding the importance of this vaccine. Patient has been advised to call insurance company to determine out of pocket expense if they have not yet received this vaccine. Advised may also receive vaccine at local pharmacy or Health Dept. Verbalized acceptance and understanding.  Screening Tests Health Maintenance  Topic Date Due   HEMOGLOBIN A1C  Never done  Pneumonia Vaccine 44+ Years old (1 - PCV) Never done   FOOT EXAM  Never done   OPHTHALMOLOGY EXAM  Never done   Hepatitis C Screening  Never done   TETANUS/TDAP  Never done   Zoster Vaccines- Shingrix (1 of 2) Never done   DEXA SCAN  Never done   COVID-19 Vaccine (3 - Booster for Moderna series) 09/05/2020   INFLUENZA VACCINE  Never done   HPV VACCINES  Aged Out    Health Maintenance  Health Maintenance Due  Topic Date Due   HEMOGLOBIN A1C  Never done   Pneumonia Vaccine 63+ Years old (1 - PCV) Never done   FOOT EXAM  Never done   OPHTHALMOLOGY EXAM  Never done   Hepatitis C Screening  Never done   TETANUS/TDAP  Never done   Zoster Vaccines- Shingrix (1 of 2) Never done   DEXA SCAN  Never done   COVID-19 Vaccine (3 - Booster for Moderna series) 09/05/2020   INFLUENZA VACCINE  Never done    Colorectal cancer screening: No longer required.   Mammogram status: Ordered 02/16/2021. Pt provided with contact info and advised to call to schedule appt.   Bone Density status: Ordered 02/16/2021. Pt provided with contact info and advised to call to schedule appt.  Lung Cancer Screening: (Low Dose CT Chest recommended if Age 73-80 years, 30 pack-year currently smoking OR have quit w/in 15years.) does not qualify.  Non smoker  Additional Screening:  Hepatitis C Screening: does qualify; Completed DUE.  Vision Screening: Recommended annual ophthalmology exams for early detection of glaucoma and other disorders of the eye. Is the patient up to date with their annual eye exam?  Yes  Who is the provider or what is the  name of the office in which the patient attends annual eye exams? Pt states she saw her MD in [place] March 2022. If pt is not established with a provider, would they like to be referred to a provider to establish care? Yes .   Dental Screening: Recommended annual dental exams for proper oral hygiene  Community Resource Referral / Chronic Care Management: CRR required this visit?  Yes   CCM required this visit?  Yes      Plan:     I have personally reviewed and noted the following in the patient's chart:   Medical and social history Use of alcohol, tobacco or illicit drugs  Current medications and supplements including opioid prescriptions.  Functional ability and status Nutritional status Physical activity Advanced directives List of other physicians Hospitalizations, surgeries, and ER visits in previous 12 months Vitals Screenings to include cognitive, depression, and falls Referrals and appointments  In addition, I have reviewed and discussed with patient certain preventive protocols, quality metrics, and best practice recommendations. A written personalized care plan for preventive services as well as general preventive health recommendations were provided to patient.     Michaelina Blandino, LPN   [id number]   Nurse Notes: PHONE VISIT. PT AT HOME. NURSE AT [facility] Pt c/o issues with walking, transportation and struggling to care for self at home. Offered CCM and CRR referrals and pt is agreeable. Orders placed. Offered to make an appointment for the patient in office due to current issues with walking and patient declined. Pt also requests orders for Mammogram and Bone Density. Orders placed.

## 2021-02-16 NOTE — Chronic Care Management (AMB) (Signed)
  Chronic Care Management   Note  02/16/2021 Name: JADALYNN BURR MRN: [id number] DOB: 09/15/1945  DUSTINE BERTINI is a 75 y.o. year old female who is a primary care patient of Ivy Lynn, NP. I reached out to Dell Ponto by phone today in response to a referral sent by Ms. Royetta Crochet Dunlop's PCP.  Ms. Perlstein was given information about Chronic Care Management services today including:  CCM service includes personalized support from designated clinical staff supervised by her physician, including individualized plan of care and coordination with other care providers 24/7 contact phone numbers for assistance for urgent and routine care needs. Service will only be billed when office clinical staff spend 20 minutes or more in a month to coordinate care. Only one practitioner may furnish and bill the service in a calendar month. The patient may stop CCM services at any time (effective at the end of the month) by phone call to the office staff. The patient is responsible for co-pay (up to 20% after annual deductible is met) if co-pay is required by the individual health plan.   Patient agreed to services and verbal consent obtained.   Follow up plan: Telephone appointment with care management team member scheduled for: 02/17/2021  Noreene Larsson, [place], [place],  [address] Direct Dial: [phone] [email or web address]

## 2021-02-17 ENCOUNTER — Ambulatory Visit: Payer: Medicare Other | Admitting: *Deleted

## 2021-02-17 DIAGNOSIS — D8689 Sarcoidosis of other sites: Secondary | ICD-10-CM

## 2021-02-17 DIAGNOSIS — I10 Essential (primary) hypertension: Secondary | ICD-10-CM

## 2021-02-17 NOTE — Chronic Care Management (AMB) (Signed)
Chronic Care Management   CCM RN Visit Note  02/17/2021 Name: Susan Jordan MRN: [id number] DOB: 24-May-1945  Subjective: Susan Jordan is a 75 y.o. year old female who is a primary care patient of Ivy Lynn, NP. The care management team was consulted for assistance with disease management and care coordination needs.    Engaged with patient by telephone for initial visit in response to provider referral for case management and/or care coordination services.   Consent to Services:  The patient was given the following information about Chronic Care Management services today, agreed to services, and gave verbal consent: 1. CCM service includes personalized support from designated clinical staff supervised by the primary care provider, including individualized plan of care and coordination with other care providers 2. 24/7 contact phone numbers for assistance for urgent and routine care needs. 3. Service will only be billed when office clinical staff spend 20 minutes or more in a month to coordinate care. 4. Only one practitioner may furnish and bill the service in a calendar month. 5.The patient may stop CCM services at any time (effective at the end of the month) by phone call to the office staff. 6. The patient will be responsible for cost sharing (co-pay) of up to 20% of the service fee (after annual deductible is met). Patient agreed to services and consent obtained.  Patient agreed to services and verbal consent obtained.   Assessment: Review of patient past medical history, allergies, medications, health status, including review of consultants reports, laboratory and other test data, was performed as part of comprehensive evaluation and provision of chronic care management services.   SDOH (Social Determinants of Health) assessments and interventions performed:    CCM Care Plan  No Known Allergies  Outpatient Encounter Medications as of 02/17/2021  Medication Sig   Blood Glucose  Monitoring Suppl (ACCU-CHEK GUIDE) w/Device KIT by Does not apply route.   glipiZIDE (GLUCOTROL XL) 5 MG 24 hr tablet Take 5 mg by mouth daily.   lisinopril-hydrochlorothiazide (ZESTORETIC) 20-25 MG tablet Take 1 tablet by mouth daily.   metoprolol succinate (TOPROL-XL) 25 MG 24 hr tablet Take 25 mg by mouth daily.   pantoprazole (PROTONIX) 40 MG tablet Take 40 mg by mouth daily.   simvastatin (ZOCOR) 40 MG tablet simvastatin 40 mg tablet   No facility-administered encounter medications on file as of 02/17/2021.    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/03/2021   Essential hypertension 12/10/2020   Mixed hyperlipidemia 12/10/2020   Establishing care with new doctor, encounter for 12/10/2020    Conditions to be addressed/monitored:HTN, DM, neurosarcoidosis, gail abnormality, transportation assistance  Care Plan : [place]  Updates made by Ilean China, RN since 02/17/2021 12:00 AM     Problem: Chronic Disease Management Needs   Priority: High  Onset Date: 02/17/2021     Long-Range Goal: Work with RN Care Manager to Create a [place] with HTN, DM, neurosarcoidosis, gail abnormality, transportation assistance needs   Start Date: 02/17/2021  Expected End Date: 02/17/2022  This Visit's Progress: On track  Priority: High  Note:   Current Barriers:  Care Coordination needs related to Transportation, Level of care concerns, and ADL IADL limitations  Chronic Disease Management support and education needs  related to HTN, DM, neurosarcoidosis, gail abnormality Transportation barriers  RNCM Clinical Goal(s):  Patient will continue to work with Consulting civil engineer and/or Social Worker to address care management and care  coordination needs related to HTN, DM, neurosarcoidosis, gail abnormality as evidenced by adherence to CM Team Scheduled appointments     through collaboration with Consulting civil engineer, provider, and care team.  Work with [place] regarding transportation assistance  Interventions: 1:1 collaboration with primary care provider regarding development and update of comprehensive plan of care as evidenced by provider attestation and co-signature Inter-disciplinary care team collaboration (see longitudinal plan of care) Evaluation of current treatment plan related to  self management and patient's adherence to plan as established by provider   SDOH Barriers (Status: New goal.) Short Term Goal  Patient interviewed and SDOH assessment performed       Referred patient to community resources care guide team for assistance with transportation Provided patient with information about health plan transportation benefit and [employer] Provided education to patient/caregiver regarding level of care options.   Neurosarcoidosis:  (Status: New goal.) Long Term Goal  Evaluation of current treatment plan related to  neurosarcoidosis  self-management and patient's adherence to plan as established by provider. Discussed plans with patient for ongoing care management follow up and provided patient with direct contact information for care management team Reviewed and discussed recent visits with PCP and neurologist Reviewed and discussed medications and importance of compliance Discussed prior successful management of neurosarcoidosis with steroid injections Reviewed and discussed upcoming appointment at [facility] for MRI of her brain and thoracic spine Discussed physical limitations, mobility challenges, and ability to perform ADLs Uses a wheelchair most of the time Does as much as she's able from the wheelchair Would like some in-home care assistance. Discussed options and that an  agency would be private pay. May be able to get CNA to come in while receiving PT. Discussed physical activity level Tries to exercise legs daily Does not want to be confined to a wheelchair permanently Interested in home physical therapy Reached out to neurologist, Dr Krista Blue, regarding her order for PT on 11/29 and that the patient hasn't been contacted to schedule Advised that PCP can order at next face-to-face office visit if it hasn't been scheduled by then. Appt notes added. Assess family/social support Has some assistance from extended family Discussed need for transportation to imaging appointments this week Collaborated with [place] regarding transportation needs Provided with RNCM contact number [phone] and encouraged to reach out as needed  Patient Goals/Self-Care Activities: Take medications as prescribed   Attend all scheduled provider appointments Call provider office for new concerns or questions  Work with Care Guides regarding transportation assistance Call [place] as needed [phone]  Plan:Telephone follow up appointment with care management team member scheduled for:  02/25/21 with RNCM The patient has been provided with contact information for the care management team and has been advised to call with any health related questions or concerns.   Chong Sicilian, BSN, RN-BC Embedded Chronic Care Manager [facility] / [place] Management Direct Dial: [phone]

## 2021-02-17 NOTE — Patient Instructions (Signed)
Visit Information  Patient Care Plan: [facility] Care Plan     Problem Identified: Chronic Disease Management Needs   Priority: High  Onset Date: 02/17/2021     Long-Range Goal: Work with RN Care Manager to Create a Care Plan Regarding Care Management and Care Coordination Associated with HTN, DM, neurosarcoidosis, gail abnormality, transportation assistance needs   Start Date: 02/17/2021  Expected End Date: 02/17/2022  This Visit's Progress: On track  Priority: High  Note:   Current Barriers:  Care Coordination needs related to Transportation, Level of care concerns, and ADL IADL limitations  Chronic Disease Management support and education needs related to HTN, DM, neurosarcoidosis, gail abnormality Transportation barriers  RNCM Clinical Goal(s):  Patient will continue to work with Medical illustrator and/or Social Worker to address care management and care coordination needs related to HTN, DM, neurosarcoidosis, gail abnormality as evidenced by adherence to CM Team Scheduled appointments     through collaboration with Medical illustrator, provider, and care team.  Work with [facility] Care Guides regarding transportation assistance  Interventions: 1:1 collaboration with primary care provider regarding development and update of comprehensive plan of care as evidenced by provider attestation and co-signature Inter-disciplinary care team collaboration (see longitudinal plan of care) Evaluation of current treatment plan related to  self management and patient's adherence to plan as established by provider   SDOH Barriers (Status: New goal.) Short Term Goal  Patient interviewed and SDOH assessment performed       Referred patient to community resources care guide team for assistance with transportation Provided patient with information about health plan transportation benefit and [employer] Provided education to patient/caregiver regarding level of care  options.   Neurosarcoidosis:  (Status: New goal.) Long Term Goal  Evaluation of current treatment plan related to  neurosarcoidosis  self-management and patient's adherence to plan as established by provider. Discussed plans with patient for ongoing care management follow up and provided patient with direct contact information for care management team Reviewed and discussed recent visits with PCP and neurologist Reviewed and discussed medications and importance of compliance Discussed prior successful management of neurosarcoidosis with steroid injections Reviewed and discussed upcoming appointment at [facility] for MRI of her brain and thoracic spine Discussed physical limitations, mobility challenges, and ability to perform ADLs Uses a wheelchair most of the time Does as much as she's able from the wheelchair Would like some in-home care assistance. Discussed options and that an agency would be private pay. May be able to get CNA to come in while receiving PT. Discussed physical activity level Tries to exercise legs daily Does not want to be confined to a wheelchair permanently Interested in home physical therapy Reached out to neurologist, Dr Terrace Arabia, regarding her order for PT on 11/29 and that the patient hasn't been contacted to schedule Advised that PCP can order at next face-to-face office visit if it hasn't been scheduled by then. Appt notes added. Assess family/social support Has some assistance from extended family Discussed need for transportation to imaging appointments this week Collaborated with [facility] Care Guides regarding transportation needs Provided with RNCM contact number [phone] and encouraged to reach out as needed  Patient Goals/Self-Care Activities: Take medications as prescribed   Attend all scheduled provider appointments Call provider office for new concerns or questions  Work with Care Guides regarding transportation assistance Call RN Care Manager as needed  [phone]       Patient verbalizes understanding of instructions provided today and agrees to view  in MyChart.    Plan:Telephone follow up appointment with care management team member scheduled for:  02/25/21 with RNCM The patient has been provided with contact information for the care management team and has been advised to call with any health related questions or concerns.   Demetrios Loll, BSN, RN-BC Embedded Chronic Care Manager [facility] / [facility] Care Management Direct Dial: [phone]

## 2021-02-19 ENCOUNTER — Ambulatory Visit (INDEPENDENT_AMBULATORY_CARE_PROVIDER_SITE_OTHER): Payer: Medicare Other | Admitting: *Deleted

## 2021-02-19 DIAGNOSIS — D8689 Sarcoidosis of other sites: Secondary | ICD-10-CM

## 2021-02-19 DIAGNOSIS — I10 Essential (primary) hypertension: Secondary | ICD-10-CM

## 2021-02-19 DIAGNOSIS — R269 Unspecified abnormalities of gait and mobility: Secondary | ICD-10-CM

## 2021-02-20 ENCOUNTER — Other Ambulatory Visit: Payer: Self-pay | Admitting: Neurology

## 2021-02-20 ENCOUNTER — Ambulatory Visit (HOSPITAL_COMMUNITY): Admission: RE | Admit: 2021-02-20 | Payer: Medicare Other | Source: Ambulatory Visit

## 2021-02-20 ENCOUNTER — Ambulatory Visit: Payer: Medicare Other | Admitting: *Deleted

## 2021-02-20 ENCOUNTER — Ambulatory Visit (HOSPITAL_COMMUNITY): Payer: Medicare Other | Attending: Neurology

## 2021-02-20 DIAGNOSIS — I10 Essential (primary) hypertension: Secondary | ICD-10-CM

## 2021-02-20 DIAGNOSIS — D8689 Sarcoidosis of other sites: Secondary | ICD-10-CM

## 2021-02-20 DIAGNOSIS — R269 Unspecified abnormalities of gait and mobility: Secondary | ICD-10-CM

## 2021-02-20 MED ORDER — ALPRAZOLAM 1 MG PO TABS: ORAL_TABLET | ORAL | 0 refills | Status: AC

## 2021-02-20 NOTE — Telephone Encounter (Signed)
I spoke to the patient. Her cousin will be taking her to the MRI and home. She will be able to stay with her afterwards. The patient is in a wheelchair and does not drive. Dr. Terrace Arabia is in agreement to provide rx for alprazolam.

## 2021-02-20 NOTE — Telephone Encounter (Signed)
Susan Cruise RN from pt's PCP called stating pt is scheduled for MRI today, however pt is very claustrophobic needing something to take for the MRI.

## 2021-02-23 NOTE — Chronic Care Management (AMB) (Signed)
Chronic Care Management   CCM RN Visit Note  02/20/2021 Name: Susan Jordan MRN: [id number] DOB: 10-02-1945  Subjective: Susan Jordan is a 75 y.o. year old female who is a primary care patient of Ivy Lynn, NP. The care management team was consulted for assistance with disease management and care coordination needs.    Engaged with patient by telephone for follow up visit in response to provider referral for case management and/or care coordination services.   Consent to Services:  The patient was given information about Chronic Care Management services, agreed to services, and gave verbal consent prior to initiation of services.  Please see initial visit note for detailed documentation.   Patient agreed to services and verbal consent obtained.   Assessment: Review of patient past medical history, allergies, medications, health status, including review of consultants reports, laboratory and other test data, was performed as part of comprehensive evaluation and provision of chronic care management services.   SDOH (Social Determinants of Health) assessments and interventions performed:    CCM Care Plan  No Known Allergies  Outpatient Encounter Medications as of 02/20/2021  Medication Sig   Blood Glucose Monitoring Suppl (ACCU-CHEK GUIDE) w/Device KIT by Does not apply route.   glipiZIDE (GLUCOTROL XL) 5 MG 24 hr tablet Take 5 mg by mouth daily.   lisinopril-hydrochlorothiazide (ZESTORETIC) 20-25 MG tablet Take 1 tablet by mouth daily.   metoprolol succinate (TOPROL-XL) 25 MG 24 hr tablet Take 25 mg by mouth daily.   pantoprazole (PROTONIX) 40 MG tablet Take 40 mg by mouth daily.   simvastatin (ZOCOR) 40 MG tablet simvastatin 40 mg tablet   No facility-administered encounter medications on file as of 02/20/2021.    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   Gait abnormality 02/03/2021   Essential hypertension 12/10/2020   Mixed hyperlipidemia 12/10/2020   Establishing care with new doctor, encounter for 12/10/2020    Conditions to be addressed/monitored:HTN, DMII, and neurosarcoidosis  Care Plan : [facility] Care Plan     Problem: Chronic Disease Management Needs   Priority: High  Onset Date: 02/17/2021     Long-Range Goal: Work with RN Care Manager to Create a [place] with HTN, DM, neurosarcoidosis, gail abnormality, transportation assistance needs   Start Date: 02/17/2021  Expected End Date: 02/17/2022  This Visit's Progress: On track  Recent Progress: On track  Priority: High  Note:   Current Barriers:  Care Coordination needs related to Transportation, Level of care concerns, and ADL IADL limitations  Chronic Disease Management support and education needs related to HTN, DM, neurosarcoidosis, gail abnormality Transportation barriers  RNCM Clinical Goal(s):  Patient will continue to work with Consulting civil engineer and/or Social Worker to address care management and care coordination needs related to HTN, DM, neurosarcoidosis, gail abnormality as evidenced by adherence to CM Team Scheduled appointments     through collaboration with Consulting civil engineer, provider, and care team.   Interventions: 1:1 collaboration with primary care provider regarding development and update of comprehensive plan of care as evidenced by provider attestation and co-signature Inter-disciplinary care team collaboration (see longitudinal plan of care) Evaluation of current treatment plan related to  self management and patient's adherence to plan as established by provider   SDOH Barriers (Status: Goal on Track (  progressing): YES.) Short Term Goal  Patient interviewed and SDOH assessment performed       Provided patient with information  about health plan transportation benefit and [facility] Provided education to patient/caregiver regarding level of care options. Collaborated with [place] [phone] regarding pickup time for appointment at [facility] today. Advised that pickup time is 11:00 am. Advised patient that [employer] will pick her up at 11:00 am Significant amount of time spent utilizing therapeutic listening regarding health related anxiety, lack of transportation, limited support, and frustration with current medical condition and medical care Discussed severe claustrophobia and patient's need for anti-anxiety medication prior to MRI Collaborated with [facility] department regarding this and relayed patient's frustration that no one assisted her in obtaining medication or advised her to reach out to her provider to request medication once she answered "yes" to the screening question, "are you claustrophobic". Patient is unable to have the MRI without being medicated Reached out to ordering provider's office, [facility] at [phone]. Explained the situation to the answering service and followed up with clinical staff regarding order for Xanax.  Confirmed that patient's cousin was able to pick up the tablets for her Reinforced instructions provided by MD on how to take medication prior to MRI Therapeutic listening utilized regarding patient's frustration/agitation with not being previously informed on how to obtain medication. Patient thanked [facility] for assistance in coordinating transportation and medication because otherwise she would have to reschedule.  RNCM sent secure email to Office of Patient Experience regarding this incident per patient's request   Neurosarcoidosis:  (Status: Condition stable. Not addressed this visit.) Long Term Goal  Evaluation of current treatment plan related to  neurosarcoidosis  self-management and patient's  adherence to plan as established by provider. Discussed plans with patient for ongoing care management follow up and provided patient with direct contact information for care management team Reviewed and discussed recent visits with PCP and neurologist Reviewed and discussed medications and importance of compliance Discussed prior successful management of neurosarcoidosis with steroid injections Reviewed and discussed upcoming appointment at [facility] for MRI of her brain and thoracic spine Discussed physical limitations, mobility challenges, and ability to perform ADLs Uses a wheelchair most of the time Does as much as she's able from the wheelchair Would like some in-home care assistance. Discussed options and that an agency would be private pay. May be able to get CNA to come in while receiving PT. Discussed physical activity level Tries to exercise legs daily Does not want to be confined to a wheelchair permanently Interested in home physical therapy Reached out to neurologist, Dr Krista Blue, regarding her order for PT on 11/29 and that the patient hasn't been contacted to schedule Advised that PCP can order at next face-to-face office visit if it hasn't been scheduled by then. Appt notes added. Assess family/social support Has some assistance from extended family Discussed need for transportation to imaging appointments this week Collaborated with [place] regarding transportation needs Provided with RNCM contact number [phone] and encouraged to reach out as needed  Patient Goals/Self-Care Activities: Take medications as prescribed   Attend all scheduled provider appointments Call provider office for new concerns or questions  Call [place] as needed [phone] Work with [facility] [phone]   Plan:Telephone follow up appointment with care management team member scheduled for:  02/25/21 with RNCM The patient has been provided with contact  information for the care management team and has been  advised to call with any health related questions or concerns.   Chong Sicilian, BSN, RN-BC Embedded Chronic Care Manager [facility] / [place] [facility] Direct Dial: [phone]

## 2021-02-23 NOTE — Chronic Care Management (AMB) (Signed)
Chronic Care Management   CCM RN Visit Note  02/19/2021 Name: Susan Jordan MRN: [id number] DOB: 07-Sep-1945  Subjective: Susan Jordan is a 75 y.o. year old female who is a primary care patient of Ivy Lynn, NP. The care management team was consulted for assistance with disease management and care coordination needs.    Engaged with patient by telephone for follow up visit in response to provider referral for case management and/or care coordination services.   Consent to Services:  The patient was given information about Chronic Care Management services, agreed to services, and gave verbal consent prior to initiation of services.  Please see initial visit note for detailed documentation.   Patient agreed to services and verbal consent obtained.   Assessment: Review of patient past medical history, allergies, medications, health status, including review of consultants reports, laboratory and other test data, was performed as part of comprehensive evaluation and provision of chronic care management services.   SDOH (Social Determinants of Health) assessments and interventions performed:    CCM Care Plan  No Known Allergies  Outpatient Encounter Medications as of 02/19/2021  Medication Sig   Blood Glucose Monitoring Suppl (ACCU-CHEK GUIDE) w/Device KIT by Does not apply route.   glipiZIDE (GLUCOTROL XL) 5 MG 24 hr tablet Take 5 mg by mouth daily.   lisinopril-hydrochlorothiazide (ZESTORETIC) 20-25 MG tablet Take 1 tablet by mouth daily.   metoprolol succinate (TOPROL-XL) 25 MG 24 hr tablet Take 25 mg by mouth daily.   pantoprazole (PROTONIX) 40 MG tablet Take 40 mg by mouth daily.   simvastatin (ZOCOR) 40 MG tablet simvastatin 40 mg tablet   No facility-administered encounter medications on file as of 02/19/2021.    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   Gait abnormality 02/03/2021   Essential hypertension 12/10/2020   Mixed hyperlipidemia 12/10/2020   Establishing care with new doctor, encounter for 12/10/2020    Conditions to be addressed/monitored:HTN, DMII, and neurosarcoidosis  Care Plan : [facility] Care Plan     Problem: Chronic Disease Management Needs   Priority: High  Onset Date: 02/17/2021     Long-Range Goal: Work with RN Care Manager to Create a [place] with HTN, DM, neurosarcoidosis, gail abnormality, transportation assistance needs   Start Date: 02/17/2021  Expected End Date: 02/17/2022  This Visit's Progress: On track  Recent Progress: On track  Priority: High  Note:   Current Barriers:  Care Coordination needs related to Transportation, Level of care concerns, and ADL IADL limitations  Chronic Disease Management support and education needs related to HTN, DM, neurosarcoidosis, gail abnormality Transportation barriers  RNCM Clinical Goal(s):  Patient will continue to work with Consulting civil engineer and/or Social Worker to address care management and care coordination needs related to HTN, DM, neurosarcoidosis, gail abnormality as evidenced by adherence to CM Team Scheduled appointments     through collaboration with Consulting civil engineer, provider, and care team.   Interventions: 1:1 collaboration with primary care provider regarding development and update of comprehensive plan of care as evidenced by provider attestation and co-signature Inter-disciplinary care team collaboration (see longitudinal plan of care) Evaluation of current treatment plan related to  self management and patient's adherence to plan as established by provider   SDOH Barriers (Status: Goal on Track (  progressing): YES.) Short Term Goal  Patient interviewed and SDOH assessment performed       Provided patient with information  about health plan transportation benefit and [facility] Provided education to patient/caregiver regarding level of care options. Collaborated with [place] [phone] to arrange transportation for patient to appointments at [facility] on 02/20/21. Advised that patient will need to be transported in her wheelchair. Advised patient that the transportation service will be in contact with her to verify pickup time on 12/16 Significant amount of time spent utilizing therapeutic listening regarding health related anxiety, lack of transportation, and limited support   Neurosarcoidosis:  (Status: Condition stable. Not addressed this visit.) Long Term Goal  Evaluation of current treatment plan related to  neurosarcoidosis  self-management and patient's adherence to plan as established by provider. Discussed plans with patient for ongoing care management follow up and provided patient with direct contact information for care management team Reviewed and discussed recent visits with PCP and neurologist Reviewed and discussed medications and importance of compliance Discussed prior successful management of neurosarcoidosis with steroid injections Reviewed and discussed upcoming appointment at [facility] for MRI of her brain and thoracic spine Discussed physical limitations, mobility challenges, and ability to perform ADLs Uses a wheelchair most of the time Does as much as she's able from the wheelchair Would like some in-home care assistance. Discussed options and that an agency would be private pay. May be able to get CNA to come in while receiving PT. Discussed physical activity level Tries to exercise legs daily Does not want to be confined to a wheelchair permanently Interested in home physical therapy Reached out to neurologist, Dr Krista Blue, regarding her order for PT on 11/29 and that the patient hasn't been contacted to schedule Advised that PCP can  order at next face-to-face office visit if it hasn't been scheduled by then. Appt notes added. Assess family/social support Has some assistance from extended family Discussed need for transportation to imaging appointments this week Collaborated with [place] regarding transportation needs Provided with RNCM contact number [phone] and encouraged to reach out as needed  Patient Goals/Self-Care Activities: Take medications as prescribed   Attend all scheduled provider appointments Call provider office for new concerns or questions  Work with [facility] [phone] Call RN Care Manager as needed [phone]   Plan:Telephone follow up appointment with care management team member scheduled for:  02/25/21 with RNCM The patient has been provided with contact information for the care management team and has been advised to call with any health related questions or concerns.   Chong Sicilian, BSN, RN-BC Embedded Chronic Care Manager [facility] / [place] Management Direct Dial: [phone]

## 2021-02-23 NOTE — Patient Instructions (Signed)
Visit Information  Patient Goals/Self-Care Activities: Take medications as prescribed   Attend all scheduled provider appointments Call provider office for new concerns or questions  Call RN Care Manager as needed [phone] Work with [facility] [phone]  Patient verbalizes understanding of instructions provided today and agrees to view in MyChart.   Plan:Telephone follow up appointment with care management team member scheduled for:  02/25/21 with RNCM The patient has been provided with contact information for the care management team and has been advised to call with any health related questions or concerns.   Demetrios Loll, BSN, RN-BC Embedded Chronic Care Manager [facility] / [facility] Care Management Direct Dial: [phone]

## 2021-02-23 NOTE — Patient Instructions (Signed)
Visit Information  Patient Goals/Self-Care Activities: Take medications as prescribed   Attend all scheduled provider appointments Call provider office for new concerns or questions  Work with [facility] [phone] Call RN Care Manager as needed [phone]  Patient verbalizes understanding of instructions provided today and agrees to view in MyChart.   Plan:Telephone follow up appointment with care management team member scheduled for:  02/25/21 with RNCM The patient has been provided with contact information for the care management team and has been advised to call with any health related questions or concerns.   Demetrios Loll, BSN, RN-BC Embedded Chronic Care Manager [facility] / [facility] Care Management Direct Dial: [phone]

## 2021-02-25 ENCOUNTER — Ambulatory Visit: Payer: Medicare Other | Admitting: *Deleted

## 2021-02-25 DIAGNOSIS — I10 Essential (primary) hypertension: Secondary | ICD-10-CM

## 2021-02-25 DIAGNOSIS — D8689 Sarcoidosis of other sites: Secondary | ICD-10-CM

## 2021-02-25 DIAGNOSIS — R269 Unspecified abnormalities of gait and mobility: Secondary | ICD-10-CM

## 2021-02-25 NOTE — Chronic Care Management (AMB) (Signed)
Chronic Care Management   CCM RN Visit Note  02/25/2021 Name: Susan Jordan MRN: [id number] DOB: 09-May-1945  Subjective: Susan Jordan is a 75 y.o. year old female who is a primary care patient of Ivy Lynn, NP. The care management team was consulted for assistance with disease management and care coordination needs.    Engaged with patient by telephone for follow up visit in response to provider referral for case management and/or care coordination services.   Consent to Services:  The patient was given information about Chronic Care Management services, agreed to services, and gave verbal consent prior to initiation of services.  Please see initial visit note for detailed documentation.   Patient agreed to services and verbal consent obtained.   Assessment: Review of patient past medical history, allergies, medications, health status, including review of consultants reports, laboratory and other test data, was performed as part of comprehensive evaluation and provision of chronic care management services.   SDOH (Social Determinants of Health) assessments and interventions performed:    CCM Care Plan  No Known Allergies  Outpatient Encounter Medications as of 02/25/2021  Medication Sig   ALPRAZolam (XANAX) 1 MG tablet Take 1-2 tablets thirty minutes prior to MRI.  May take one additional tablet before entering scanner, if needed.  MUST HAVE DRIVER.   Blood Glucose Monitoring Suppl (ACCU-CHEK GUIDE) w/Device KIT by Does not apply route.   glipiZIDE (GLUCOTROL XL) 5 MG 24 hr tablet Take 5 mg by mouth daily.   lisinopril-hydrochlorothiazide (ZESTORETIC) 20-25 MG tablet Take 1 tablet by mouth daily.   metoprolol succinate (TOPROL-XL) 25 MG 24 hr tablet Take 25 mg by mouth daily.   pantoprazole (PROTONIX) 40 MG tablet Take 40 mg by mouth daily.   simvastatin (ZOCOR) 40 MG tablet simvastatin 40 mg tablet   No facility-administered encounter medications on file as of 02/25/2021.     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   Gait abnormality 02/03/2021   Essential hypertension 12/10/2020   Mixed hyperlipidemia 12/10/2020   Establishing care with new doctor, encounter for 12/10/2020    Conditions to be addressed/monitored:HTN, DMII, and neurosarcoidosis  Care Plan : [place]  Updates made by Ilean China, RN since 02/25/2021 12:00 AM     Problem: Chronic Disease Management Needs   Priority: High  Onset Date: 02/17/2021     Long-Range Goal: Work with RN Care Manager to Create a [place] with HTN, DM, neurosarcoidosis, gail abnormality, transportation assistance needs   Start Date: 02/17/2021  Expected End Date: 02/17/2022  This Visit's Progress: On track  Recent Progress: On track  Priority: High  Note:   Current Barriers:  Care Coordination needs related to Transportation, Level of care concerns, and ADL IADL limitations  Chronic Disease Management support and education needs related to HTN, DM, neurosarcoidosis, gail abnormality Transportation barriers  RNCM Clinical Goal(s):  Patient will continue to work with Consulting civil engineer and/or Social Worker to address care management and care coordination needs related to HTN, DM, neurosarcoidosis, gail abnormality as evidenced by adherence to CM Team Scheduled appointments     through collaboration with Consulting civil engineer, provider, and care team.   Interventions: 1:1 collaboration with primary care provider regarding development and update of comprehensive plan of care as  evidenced by provider attestation and co-signature Inter-disciplinary care team collaboration (see longitudinal plan of care) Evaluation of current treatment plan related to  self management and  patient's adherence to plan as established by provider   SDOH Barriers (Status: Goal on Track (progressing): YES.) Short Term Goal  Patient interviewed and SDOH assessment performed    Collaborated with [facility] Scheduling to reschedule MRI Brain and thoracic spine for 03/10/20 at 11:00am at [facility] with [facility] [phone] to arrange transportation through [place] to her MRI appointment at [facility] on 03/10/21 at 11:00 with an arrival time of 10:30. Advised patient that [employer] will call her the day before to verify pickup time. Patient has [place] contact number as well. Significant amount of time spent utilizing therapeutic listening regarding health related anxiety, lack of transportation, limited support, and frustration with current medical condition and medical care Discussed severe claustrophobia and patient's need for anti-anxiety medication prior to MRI Previously reached out to ordering provider's office, [facility] at [phone]. Explained the situation to the answering service and followed up with clinical staff regarding order for Xanax. Discussed that medication was not sent electronically to pharmacy before her pickup time and she had to miss her prior MRI appointment due to that. Confirmed that she does have the medication in her possession now.  Reinforced instructions provided by MD on how to take medication prior to MRI Advised that RNCM will be out of the office beginning at 2:00 pm on 12/23 and will return on 03/10/21 and to reach out to LCSW with any CCM concerns   Neurosarcoidosis:  (Status: Condition stable. Not addressed this visit.) Long Term Goal  Evaluation of current treatment plan related to  neurosarcoidosis  self-management and patient's adherence to plan as established by provider. Discussed plans with patient for ongoing care management follow up and provided patient with direct contact  information for care management team Reviewed and discussed recent visits with PCP and neurologist Reviewed and discussed medications and importance of compliance Discussed prior successful management of neurosarcoidosis with steroid injections Reviewed and discussed upcoming appointment at [facility] for MRI of her brain and thoracic spine Discussed physical limitations, mobility challenges, and ability to perform ADLs Uses a wheelchair most of the time Does as much as she's able from the wheelchair Would like some in-home care assistance. Discussed options and that an agency would be private pay. May be able to get CNA to come in while receiving PT. Discussed physical activity level Tries to exercise legs daily Does not want to be confined to a wheelchair permanently Interested in home physical therapy Reached out to neurologist, Dr Krista Blue, regarding her order for PT on 11/29 and that the patient hasn't been contacted to schedule Advised that PCP can order at next face-to-face office visit if it hasn't been scheduled by then. Appt notes added. Assess family/social support Has some assistance from extended family Discussed need for transportation to imaging appointments this week Collaborated with [place] regarding transportation needs Provided with RNCM contact number [phone] and encouraged to reach out as needed  Patient Goals/Self-Care Activities: Take medications as prescribed   Attend all scheduled provider appointments Call provider office for new concerns or questions  Call RN Care Manager as needed [phone] Work with [facility] [phone] Reach out to [employer], [place] with any CCM needs between 12/27 and 12/30  Plan:Telephone follow up appointment with care management team member scheduled for:  03/12/21  with RNCM The patient has been provided with contact information for the care management team and has been advised to call with  any health related questions or concerns.   Chong Sicilian, BSN, RN-BC Embedded Chronic Care Manager [facility] / [place] Management Direct Dial: [phone]

## 2021-02-25 NOTE — Patient Instructions (Signed)
Visit Information  Patient Goals/Self-Care Activities: Take medications as prescribed   Attend all scheduled provider appointments Call provider office for new concerns or questions  Call RN Care Manager as needed [phone] Work with [facility] [phone] Reach out to [place], [place] [phone] with any CCM needs between 12/27 and 12/30  Patient verbalizes understanding of instructions provided today and agrees to view in MyChart.   Plan:Telephone follow up appointment with care management team member scheduled for:  03/12/21 with RNCM The patient has been provided with contact information for the care management team and has been advised to call with any health related questions or concerns.   Demetrios Loll, BSN, RN-BC Embedded Chronic Care Manager [facility] / [facility] Care Management Direct Dial: [phone]

## 2021-03-07 DIAGNOSIS — I10 Essential (primary) hypertension: Secondary | ICD-10-CM

## 2021-03-10 ENCOUNTER — Other Ambulatory Visit: Payer: Self-pay

## 2021-03-10 ENCOUNTER — Ambulatory Visit (HOSPITAL_COMMUNITY)
Admission: RE | Admit: 2021-03-10 | Discharge: 2021-03-10 | Disposition: A | Discharge status: Home / Self Care | Payer: Medicare Other | Source: Ambulatory Visit | Attending: Neurology | Admitting: Neurology

## 2021-03-10 DIAGNOSIS — R269 Unspecified abnormalities of gait and mobility: Secondary | ICD-10-CM | POA: Insufficient documentation

## 2021-03-10 DIAGNOSIS — D8689 Sarcoidosis of other sites: Secondary | ICD-10-CM | POA: Insufficient documentation
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
## 2021-03-10 IMAGING — MR MR THORACIC SPINE W/O CM
5 of 6 series · 26 of 48 positions shown · non-contrast
Comparison: None.

CLINICAL DATA: Neurosarcoidosis [1P] ([1P]-CM). Ataxia,
nontraumatic, thoracic pathology suspected. Gait abnormality [1P]
([1P]-CM).

EXAM:
MRI THORACIC SPINE WITHOUT CONTRAST
TECHNIQUE: Multiplanar, multisequence MR imaging of the thoracic spine was
performed. No intravenous contrast was administered.

[Series 115: T1 · sagittal · 5.0mm · 1.77mm/px · 2 of 9 slices shown (1 of 2)]
[im 1/9]
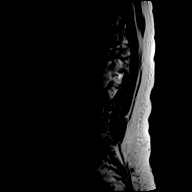
[im 9/9]
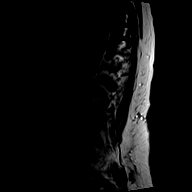

[Series 116: T2 · sagittal · 3.0mm · 0.82mm/px · 6 of 17 slices shown (1 of 2)]
[im 1/17]
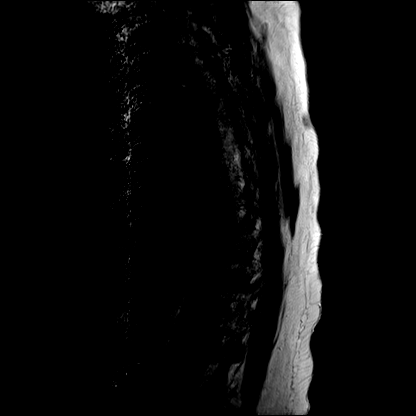
[im 4/17]
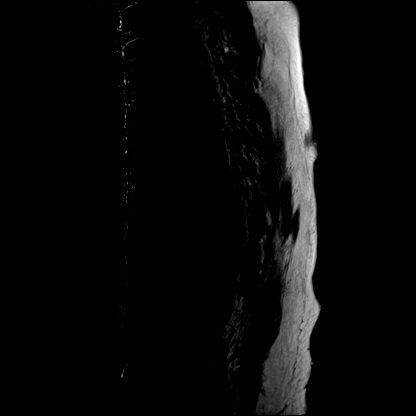
[im 7/17]
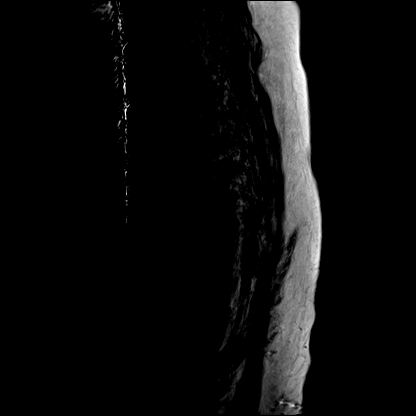
[im 10/17]
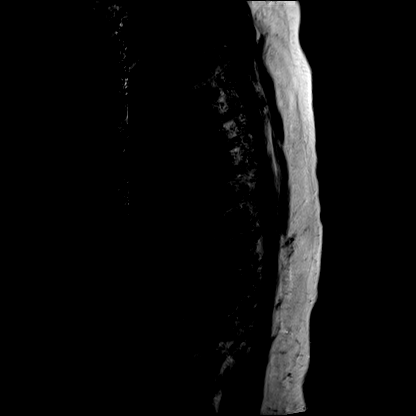
[im 13/17]
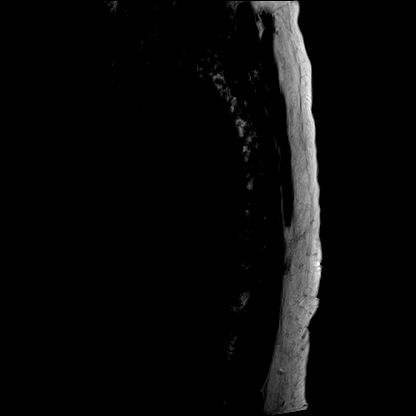
[im 17/17]
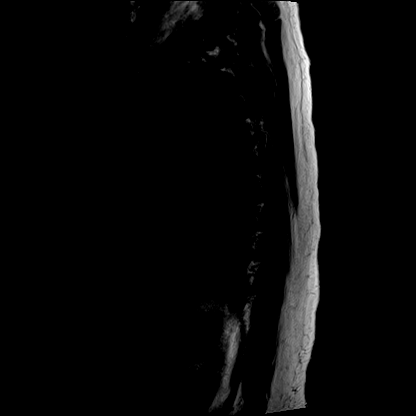

[Series 117: T1 · sagittal · 3.0mm · 0.79mm/px · 6 of 17 slices shown (2 of 2)]
[im 1/17]
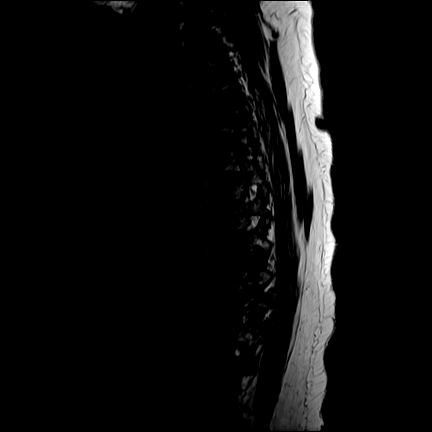
[im 4/17]
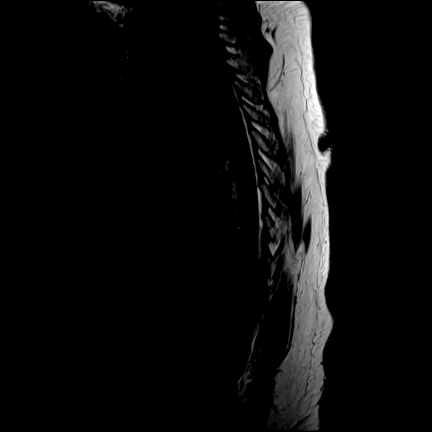
[im 7/17]
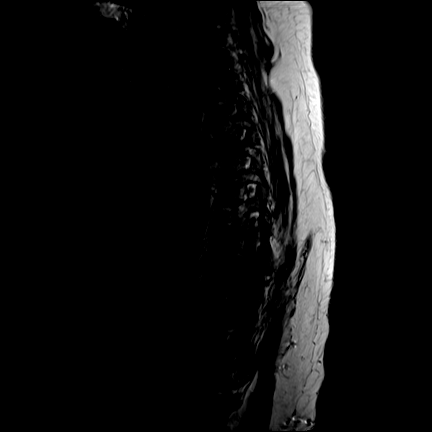
[im 10/17]
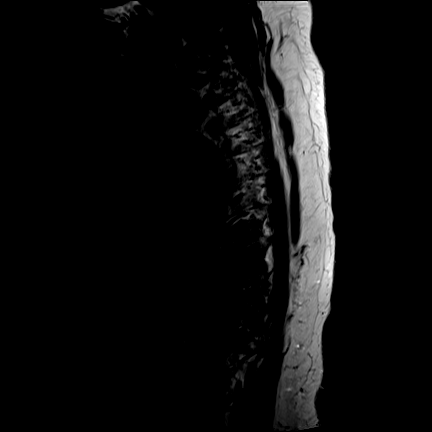
[im 13/17]
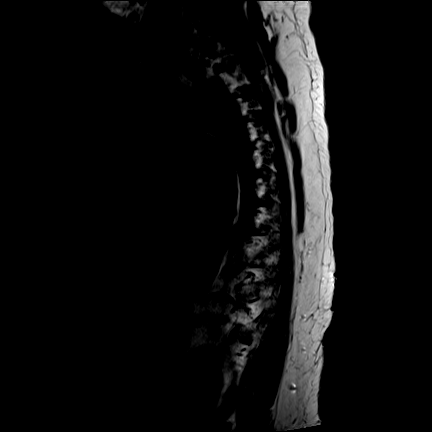
[im 17/17]
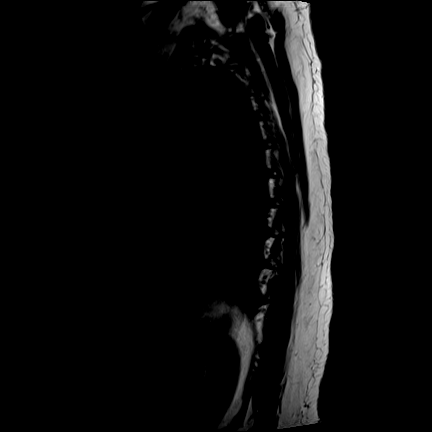

[Series 118: STIR · sagittal · 3.0mm · 0.48mm/px · 4 of 17 slices shown]
[im 1/17]
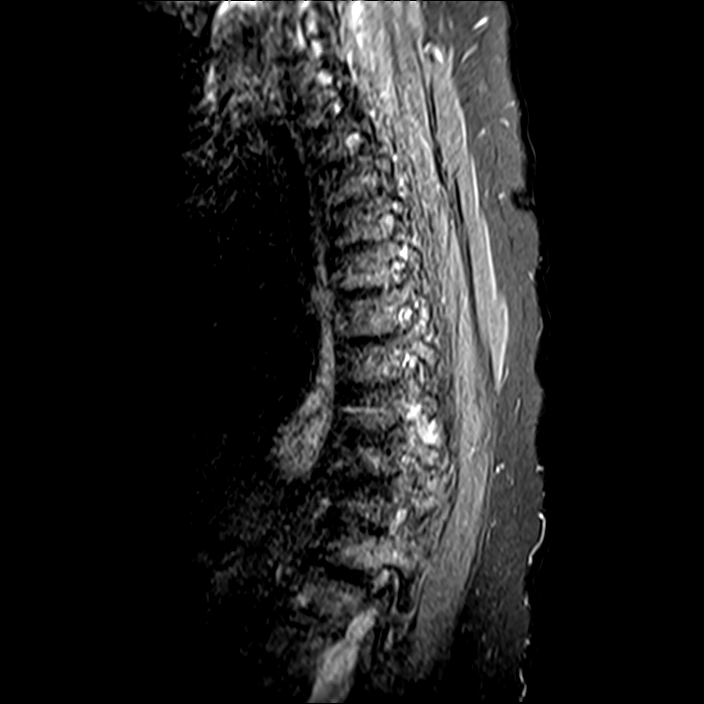
[im 4/17]
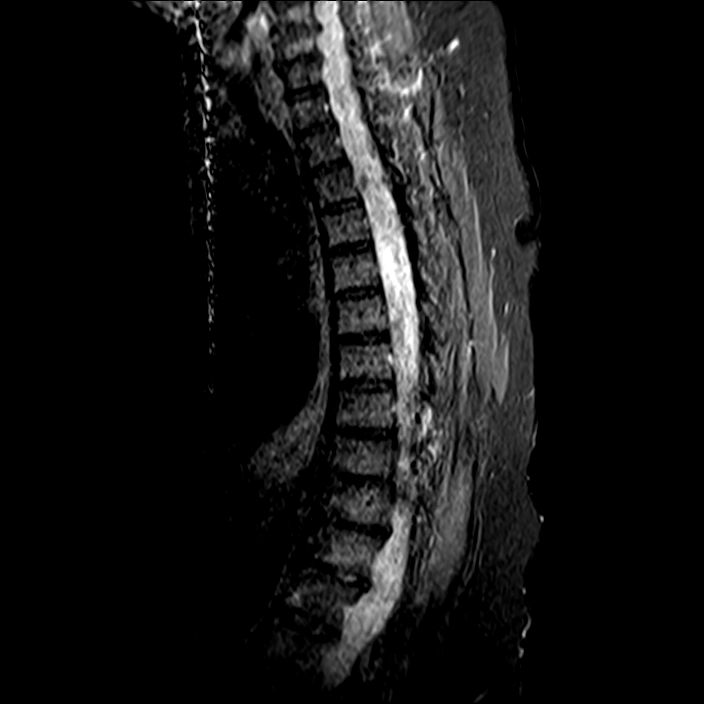
[im 7/17]
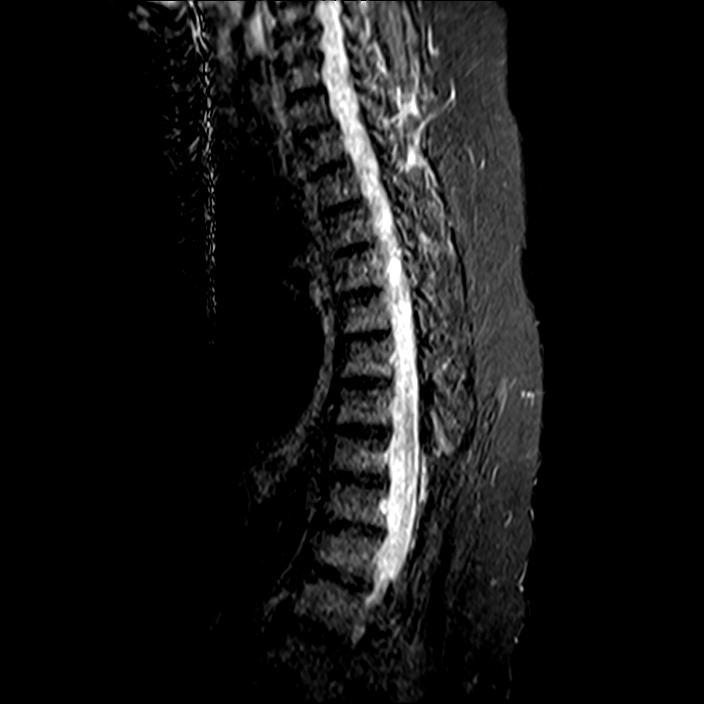
[im 10/17]
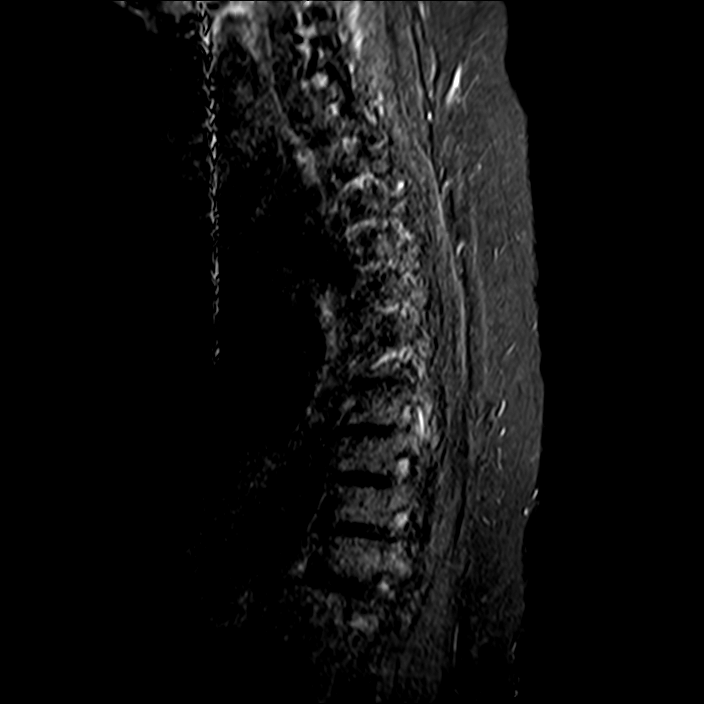

[Series 119: T2 · axial · 4.0mm · 0.70mm/px · z∈[-383,-176]mm · 8 of 39 slices shown (2 of 2)]
[im 1/39]
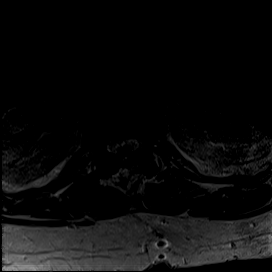
[im 6/39]
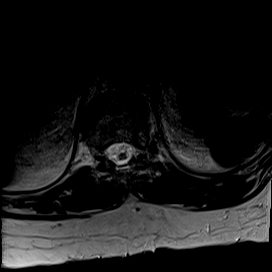
[im 12/39]
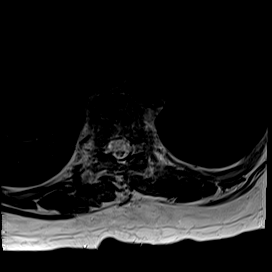
[im 18/39]
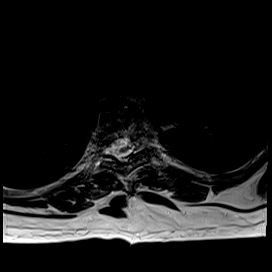
[im 21/39]
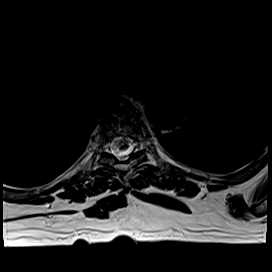
[im 27/39]
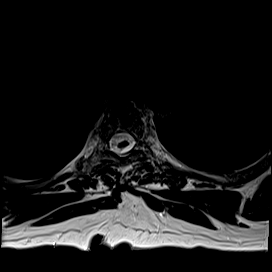
[im 33/39]
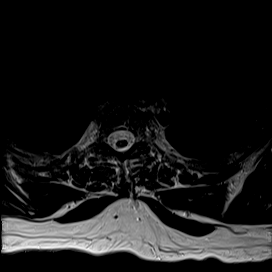
[im 39/39]
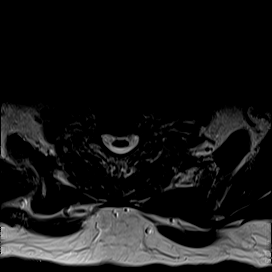

[26 of 48 positions shown; findings below may reference images not displayed]

FINDINGS: The study is severely degraded by motion.

Alignment: Dextroconvex scoliosis.

Vertebrae: No obvious fracture, evidence of discitis, or aggressive
bone lesion.

Cord:  Grossly unremarkable.

Paraspinal and other soft tissues: Negative.

Disc levels:

No high-grade spinal canal or neural foraminal stenosis identified.
IMPRESSION: The study is severely degraded by motion. Within the limitation, no
significant pathology of the thoracic spine is identified.

## 2021-03-12 ENCOUNTER — Telehealth: Payer: Medicare Other

## 2021-03-13 ENCOUNTER — Telehealth: Payer: Self-pay

## 2021-03-13 ENCOUNTER — Ambulatory Visit: Payer: Medicare Other | Admitting: Nurse Practitioner

## 2021-03-13 NOTE — Telephone Encounter (Signed)
° °  Telephone encounter was:  Successful.  03/13/2021 Name: SAYWARD HORVATH MRN: [id number] DOB: Jun 20, 1945  CAROLA VIRAMONTES is a 76 y.o. year old female who is a primary care patient of Daryll Drown, NP . The community resource team was consulted for assistance with Transportation Needs  and in-home care.  Care guide performed the following interventions: Spoke with patient about resources for transportation and in-home care.  Confirmed email address [email or web address] send information for [employer], [employer] and [employer] on Aging for [employer]	[place] and [place].  Follow Up Plan:  Care guide will follow up with patient by phone over the next 7-10 days.  Gurjot Brisco, AAS Paralegal, [facility] Care Guide  Embedded Care Coordination [place]   Care Management  [address] [place], [place] [address] ??[email or web address]   ?? [phone]   [email or web address]

## 2021-03-17 ENCOUNTER — Ambulatory Visit: Payer: Medicare Other | Admitting: Nurse Practitioner

## 2021-03-17 ENCOUNTER — Telehealth: Payer: Medicare Other

## 2021-03-18 ENCOUNTER — Encounter: Payer: Self-pay | Admitting: Nurse Practitioner

## 2021-03-24 ENCOUNTER — Ambulatory Visit: Payer: Medicare Other | Admitting: Nurse Practitioner

## 2021-03-26 ENCOUNTER — Telehealth: Payer: Self-pay

## 2021-03-26 NOTE — Telephone Encounter (Signed)
° °  Telephone encounter was:  Successful.  03/26/2021 Name: HALENA MOHAR MRN: [id number] DOB: [birth year]  AUSET FRITZLER is a 76 y.o. year old female who is a primary care patient of Daryll Drown, NP . The community resource team was consulted for assistance with Transportation Needs  and in home care  Care guide performed the following interventions: Spoke with patient she has someone to come into the home to assist her.  Gave her information for [employer] [phone].   Follow Up Plan:  No further follow up planned at this time. The patient has been provided with needed resources.  Bravlio Luca, AAS Paralegal, [facility] Care Guide  Embedded Care Coordination [place]   Care Management  [address] [place], [place] [address] ??[email or web address]   ?? [phone]   [email or web address]

## 2021-04-30 ENCOUNTER — Telehealth: Payer: Self-pay | Admitting: Neurology

## 2021-04-30 NOTE — Telephone Encounter (Signed)
error 

## 2021-05-04 ENCOUNTER — Encounter: Payer: Self-pay | Admitting: Neurology

## 2021-05-21 ENCOUNTER — Other Ambulatory Visit: Payer: Self-pay | Admitting: Nurse Practitioner

## 2021-06-25 ENCOUNTER — Telehealth: Payer: Self-pay | Admitting: Nurse Practitioner

## 2021-06-25 ENCOUNTER — Telehealth: Payer: Self-pay

## 2021-06-25 NOTE — Telephone Encounter (Signed)
Patient had called in regards to mammogram and DXA that was scheduled for Monday 06/29/2021 - patient canceled both and advised office that she had her mammogram done today at external location.  I called patient back to see if she wanted to reschedule the DXA appt no answer lmtcb.  ?

## 2021-06-29 ENCOUNTER — Other Ambulatory Visit: Payer: Medicare Other

## 2021-08-06 ENCOUNTER — Ambulatory Visit: Payer: Medicare Other | Admitting: Family Medicine

## 2021-10-06 ENCOUNTER — Ambulatory Visit: Payer: Self-pay | Admitting: *Deleted

## 2021-10-06 DIAGNOSIS — D8689 Sarcoidosis of other sites: Secondary | ICD-10-CM

## 2021-10-06 NOTE — Patient Instructions (Signed)
Susan Jordan  At some point during the past 4 years, I have worked with you through the Chronic Care Management Program at [facility].  Due to program changes I am removing myself from your care team.   If you are currently active with another CCM Team Member, you will remain active with them unless they reach out to you with additional information.   If you feel that you need services in the future,  please talk with your primary care provider and request a new referral for Care Management or Care Coordination services. This does not affect your status as a patient at [facility].   Thank you for allowing me to participate in your your healthcare journey.  Demetrios Loll, BSN, RN-BC Engineer, materials Dial: [phone]

## 2021-10-06 NOTE — Chronic Care Management (AMB) (Signed)
  Chronic Care Management   Note  10/06/2021 Name: Susan Jordan MRN: [id number] DOB: 19-Oct-1945   Due to changes in the Chronic Care Management program, I am removing myself as the RN Care Manager from the Care Team and closing RN Care Management Care Plans. Patient was not scheduled to be followed by the RN Care Coordination nurse for [facility].   Patient does not have an open Care Plan with another CCM team member.  Patient does not have a current CCM referral placed since 07/06/21. CCM enrollment status changed to "not enrolled".   Patient's PCP can place a new referral if the they needs Care Management or Care Coordination services in the future.  Demetrios Loll, BSN, RN-BC Engineer, materials Dial: [phone]
